# Patient Record
Sex: Female | Born: 1950 | Race: White | Hispanic: No | Smoking: Never smoker
Health system: Southern US, Community
[De-identification: ages and names within clinical notes are randomized; demographics above are authoritative.]

## PROBLEM LIST (undated history)

## (undated) DIAGNOSIS — E7800 Pure hypercholesterolemia, unspecified: Secondary | ICD-10-CM

## (undated) DIAGNOSIS — G20A1 Parkinson's disease without dyskinesia, without mention of fluctuations: Secondary | ICD-10-CM

## (undated) DIAGNOSIS — M4800 Spinal stenosis, site unspecified: Secondary | ICD-10-CM

## (undated) DIAGNOSIS — G20 Parkinson's disease: Secondary | ICD-10-CM

## (undated) DIAGNOSIS — B009 Herpesviral infection, unspecified: Secondary | ICD-10-CM

## (undated) DIAGNOSIS — K5792 Diverticulitis of intestine, part unspecified, without perforation or abscess without bleeding: Secondary | ICD-10-CM

## (undated) DIAGNOSIS — R29818 Other symptoms and signs involving the nervous system: Secondary | ICD-10-CM

## (undated) HISTORY — DX: Pure hypercholesterolemia, unspecified: E78.00

## (undated) HISTORY — DX: Parkinson's disease: G20

## (undated) HISTORY — DX: Spinal stenosis, site unspecified: M48.00

## (undated) HISTORY — DX: Other symptoms and signs involving the nervous system: R29.818

## (undated) HISTORY — DX: Diverticulitis of intestine, part unspecified, without perforation or abscess without bleeding: K57.92

## (undated) HISTORY — DX: Parkinson's disease without dyskinesia, without mention of fluctuations: G20.A1

## (undated) HISTORY — DX: Herpesviral infection, unspecified: B00.9

---

## 1985-11-25 HISTORY — PX: BARTHOLIN CYST MARSUPIALIZATION: SHX5383

## 2000-04-14 ENCOUNTER — Ambulatory Visit (HOSPITAL_COMMUNITY): Admission: RE | Admit: 2000-04-14 | Discharge: 2000-04-14 | Payer: Self-pay | Admitting: Internal Medicine

## 2003-08-31 ENCOUNTER — Encounter: Payer: Self-pay | Admitting: Obstetrics and Gynecology

## 2003-08-31 ENCOUNTER — Ambulatory Visit (HOSPITAL_COMMUNITY): Admission: RE | Admit: 2003-08-31 | Discharge: 2003-08-31 | Payer: Self-pay | Admitting: Obstetrics and Gynecology

## 2005-09-27 ENCOUNTER — Ambulatory Visit (HOSPITAL_COMMUNITY): Admission: RE | Admit: 2005-09-27 | Discharge: 2005-09-27 | Payer: Self-pay | Admitting: Obstetrics and Gynecology

## 2007-06-29 ENCOUNTER — Encounter: Admission: RE | Admit: 2007-06-29 | Discharge: 2007-06-29 | Payer: Self-pay | Admitting: Internal Medicine

## 2008-01-19 ENCOUNTER — Encounter: Admission: RE | Admit: 2008-01-19 | Discharge: 2008-01-19 | Payer: Self-pay | Admitting: Internal Medicine

## 2008-06-08 ENCOUNTER — Encounter: Admission: RE | Admit: 2008-06-08 | Discharge: 2008-06-08 | Payer: Self-pay | Admitting: Obstetrics and Gynecology

## 2009-09-22 ENCOUNTER — Encounter: Admission: RE | Admit: 2009-09-22 | Discharge: 2009-09-22 | Payer: Self-pay | Admitting: Obstetrics and Gynecology

## 2010-12-14 ENCOUNTER — Ambulatory Visit (HOSPITAL_COMMUNITY)
Admission: RE | Admit: 2010-12-14 | Discharge: 2010-12-14 | Payer: Self-pay | Source: Home / Self Care | Attending: Obstetrics and Gynecology | Admitting: Obstetrics and Gynecology

## 2010-12-14 LAB — HM MAMMOGRAPHY: HM Mammogram: NORMAL

## 2010-12-17 ENCOUNTER — Encounter: Payer: Self-pay | Admitting: Interventional Radiology

## 2010-12-18 LAB — HM PAP SMEAR: HM Pap smear: NORMAL

## 2010-12-26 ENCOUNTER — Encounter: Payer: Self-pay | Admitting: Internal Medicine

## 2011-04-12 NOTE — Op Note (Signed)
[facility]  Patient:    Eileen, Cook                           MRN: [id number] Proc. Date: 04/14/00 Attending:  Verlin Grills, M.D. CC:         Eileen Cook. Earl Gala, M.D.                           Operative Report  PROCEDURE PERFORMED:  Proctocolonoscopy.  ENDOSCOPIST:  Verlin Grills, M.D.  INDICATIONS FOR PROCEDURE:  Ms. Eileen Cook is a 60 year old female.  Her mother and grandmother have undergone surgery to treat colon cancer.  Eileen Cook is due for colorectal neoplasia screening.  In 1996, using the adult colonoscope, I was unable to complete a full colonoscopy and Eileen Cook required an air contrast barium enema.  There was no evidence of colorectal neoplasia in 1996.  I discussed with Eileen Cook the complications associated with colonoscopy and polypectomy including intestinal bleeding and intestinal perforation.  Eileen Cook has signed the operative permit.  I plan to use the pediatric colonoscope for the exam today.  PREMEDICATION:  Demerol 50 mg, Versed 9.5 mg.  ENDOSCOPE:  Olympus pediatric colonoscope.  DESCRIPTION OF PROCEDURE:  After obtaining informed consent, the patient was placed in the left lateral decubitus position.  I administered intravenous Demerol and intravenous Versed to achieve conscious sedation for the procedure.  The patients blood pressure, oxygen saturations and cardiac rhythm were monitored throughout the procedure and documented in the medical record.  Anal inspection was normal.  Digital rectal exam was normal.  The Olympus pediatric video colonoscope was introduced into the rectum and under direct vision advanced to the cecum as identified by a normal-appearing ileocecal valve and appendiceal orifice.  Colonic preparation for the exam today was excellent.  Rectum normal.  Sigmoid colon normal.  Descending colon normal.  Splenic flexure normal.  Transverse colon normal.  Hepatic flexure  normal.  Ascending colon normal.  Cecum and ileocecal valve normal.  ASSESSMENT:  Normal proctocolonoscopy to the cecum using the pediatric video colonoscope.  RECOMMENDATIONS:  Repeat colonoscopy in May 2006. DD:  04/14/00 TD:  04/17/00 Job: [id number]

## 2011-06-05 ENCOUNTER — Encounter: Payer: Self-pay | Admitting: Family Medicine

## 2011-06-23 ENCOUNTER — Other Ambulatory Visit: Payer: Self-pay | Admitting: Internal Medicine

## 2011-06-23 DIAGNOSIS — R209 Unspecified disturbances of skin sensation: Secondary | ICD-10-CM

## 2013-01-11 ENCOUNTER — Other Ambulatory Visit: Payer: Self-pay | Admitting: Obstetrics and Gynecology

## 2013-01-11 DIAGNOSIS — N63 Unspecified lump in unspecified breast: Secondary | ICD-10-CM

## 2013-01-11 DIAGNOSIS — M8580 Other specified disorders of bone density and structure, unspecified site: Secondary | ICD-10-CM

## 2013-01-20 ENCOUNTER — Other Ambulatory Visit: Payer: Self-pay

## 2013-02-10 ENCOUNTER — Ambulatory Visit
Admission: RE | Admit: 2013-02-10 | Discharge: 2013-02-10 | Disposition: A | Discharge status: Home / Self Care | Payer: BC Managed Care – PPO | Source: Ambulatory Visit | Attending: Obstetrics and Gynecology | Admitting: Obstetrics and Gynecology

## 2013-02-10 DIAGNOSIS — M8580 Other specified disorders of bone density and structure, unspecified site: Secondary | ICD-10-CM

## 2013-02-10 DIAGNOSIS — N63 Unspecified lump in unspecified breast: Secondary | ICD-10-CM

## 2013-03-09 ENCOUNTER — Other Ambulatory Visit: Payer: Self-pay | Admitting: Obstetrics and Gynecology

## 2013-03-09 DIAGNOSIS — B009 Herpesviral infection, unspecified: Secondary | ICD-10-CM

## 2013-03-09 MED ORDER — ACYCLOVIR 200 MG PO CAPS: 400.0000 mg | ORAL_CAPSULE | Freq: Every day | ORAL | Status: DC

## 2013-03-10 ENCOUNTER — Telehealth: Payer: Self-pay | Admitting: Obstetrics & Gynecology

## 2013-03-10 NOTE — Telephone Encounter (Signed)
pt calling to check on the status of her refill not sure of the name

## 2013-08-20 ENCOUNTER — Encounter: Payer: Self-pay | Admitting: Obstetrics & Gynecology

## 2014-03-09 ENCOUNTER — Ambulatory Visit: Payer: Self-pay | Admitting: Obstetrics & Gynecology

## 2014-03-11 ENCOUNTER — Encounter: Payer: Self-pay | Admitting: Obstetrics & Gynecology

## 2014-03-11 ENCOUNTER — Ambulatory Visit (INDEPENDENT_AMBULATORY_CARE_PROVIDER_SITE_OTHER): Payer: 59 | Admitting: Obstetrics & Gynecology

## 2014-03-11 VITALS — BP 138/78 | HR 60 | Resp 16 | Ht 68.75 in | Wt 185.4 lb

## 2014-03-11 DIAGNOSIS — Z0000 Encounter for general adult medical examination without abnormal findings: Secondary | ICD-10-CM

## 2014-03-11 DIAGNOSIS — Z124 Encounter for screening for malignant neoplasm of cervix: Secondary | ICD-10-CM

## 2014-03-11 DIAGNOSIS — Z01419 Encounter for gynecological examination (general) (routine) without abnormal findings: Secondary | ICD-10-CM

## 2014-03-11 DIAGNOSIS — B009 Herpesviral infection, unspecified: Secondary | ICD-10-CM

## 2014-03-11 MED ORDER — ACYCLOVIR 200 MG PO CAPS: 400.0000 mg | ORAL_CAPSULE | Freq: Every day | ORAL | Status: DC

## 2014-03-11 NOTE — Progress Notes (Signed)
63 y.o. G3P2 SingleCaucasianF here for annual exam.  Doing really well.  No vaginal bleeding.  Had an issue with a sebaceous cyst on her breast last year.  This was evaluated with MMG and ultrasound last year.    Seeing Dr. Dennard Schaumann as new   Patient's last menstrual period was 11/25/2001.          Sexually active: no  The current method of family planning is post menopausal status.    Exercising: yes  yoga, aerobic, and weights Smoker:  no  Health Maintenance: Pap:  12/09/11 WNL/negative HR HPV History of abnormal Pap:  no MMG:  02/10/13 MMG/US-normal Colonoscopy:  2010-repeat in 10 years, Dr. Edwina Barth BMD:   02/10/13, 0.0/-0.7 TDaP:  2014 Screening Labs: PCP, Hb today: PCP, Urine today: PCP   reports that she has never smoked. She has never used smokeless tobacco. She reports that she drinks about one ounce of alcohol per week. She reports that she does not use illicit drugs.  Past Medical History  Diagnosis Date  . Elevated cholesterol   . HSV-2 infection   . 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 Cesarean section    . Bartholin cyst marsupialization      Current Outpatient Prescriptions  Medication Sig Dispense Refill  . acyclovir (ZOVIRAX) 200 MG capsule Take 2 capsules (400 mg total) by mouth daily.  30 capsule  9  . celecoxib (CELEBREX) 100 MG capsule Take 100 mg by mouth as needed.         No current facility-administered medications for this visit.    Family History  Problem Relation Age of Onset  . Colon cancer Mother   . Colon cancer Maternal Grandmother   . Esophageal cancer Father   . Hypertension Sister     #1  . Mitral valve prolapse Sister     #2    ROS:  Pertinent items are noted in HPI.  Otherwise, a comprehensive ROS was negative.  Exam:   BP 138/78  Pulse 60  Resp 16  Ht 5' 8.75" (1.746 m)  Wt 185 lb 6.4 oz (84.097 kg)  BMI 27.59 kg/m2  LMP 11/25/2001  Weight change: +5lbs  Height: 5' 8.75"  (174.6 cm)  Ht Readings from Last 3 Encounters:  03/11/14 5' 8.75" (1.746 m)    General appearance: alert, cooperative and appears stated age Head: Normocephalic, without obvious abnormality, atraumatic Neck: no adenopathy, supple, symmetrical, trachea midline and thyroid normal to inspection and palpation Lungs: clear to auscultation bilaterally Breasts: normal appearance, no masses or tenderness Heart: regular rate and rhythm Abdomen: soft, non-tender; bowel sounds normal; no masses,  no organomegaly Extremities: extremities normal, atraumatic, no cyanosis or edema Skin: Skin color, texture, turgor normal. No rashes or lesions Lymph nodes: Cervical, supraclavicular, and axillary nodes normal. No abnormal inguinal nodes palpated Neurologic: Grossly normal   Pelvic: External genitalia:  no lesions              Urethra:  normal appearing urethra with no masses, tenderness or lesions              Bartholins and Skenes: normal                 Vagina: normal appearing vagina with normal color and discharge, no lesions              Cervix: no lesions  Pap taken: yes Bimanual Exam:  Uterus:  normal size, contour, position, consistency, mobility, non-tender              Adnexa: normal adnexa               Rectovaginal: Confirms               Anus:  normal sphincter tone, no lesions  A:  Well Woman with normal exam PMP, no HRT Family hx of colon cancer, mother  P:   Mammogram due.  Pt aware and she will schedule. pap smear only today Acyclovir 400mg  bid (pt takes daily).  D/W pt appropriate dosing for suppressive therapy.  #180/4RF.  Rx shingles vaccine return annually or prn  An After Visit Summary was printed and given to the patient.

## 2014-03-11 NOTE — Patient Instructions (Addendum)

## 2014-03-15 LAB — IPS PAP SMEAR ONLY

## 2014-09-26 ENCOUNTER — Encounter: Payer: Self-pay | Admitting: Obstetrics & Gynecology

## 2014-11-05 ENCOUNTER — Ambulatory Visit (HOSPITAL_COMMUNITY)
Admission: RE | Admit: 2014-11-05 | Discharge: 2014-11-05 | Disposition: A | Discharge status: Home / Self Care | Payer: Medicare HMO | Source: Ambulatory Visit | Attending: Internal Medicine | Admitting: Internal Medicine

## 2014-11-05 ENCOUNTER — Other Ambulatory Visit (HOSPITAL_COMMUNITY): Payer: Self-pay | Admitting: Internal Medicine

## 2014-11-05 DIAGNOSIS — M545 Low back pain: Secondary | ICD-10-CM | POA: Insufficient documentation

## 2014-11-05 DIAGNOSIS — Z87828 Personal history of other (healed) physical injury and trauma: Secondary | ICD-10-CM | POA: Diagnosis not present

## 2014-11-05 DIAGNOSIS — M4854XA Collapsed vertebra, not elsewhere classified, thoracic region, initial encounter for fracture: Secondary | ICD-10-CM | POA: Diagnosis not present

## 2014-11-05 DIAGNOSIS — I700 Atherosclerosis of aorta: Secondary | ICD-10-CM | POA: Insufficient documentation

## 2014-11-05 DIAGNOSIS — W109XXA Fall (on) (from) unspecified stairs and steps, initial encounter: Secondary | ICD-10-CM | POA: Insufficient documentation

## 2014-11-05 DIAGNOSIS — R52 Pain, unspecified: Secondary | ICD-10-CM

## 2014-11-05 DIAGNOSIS — M2578 Osteophyte, vertebrae: Secondary | ICD-10-CM | POA: Diagnosis not present

## 2014-12-26 ENCOUNTER — Telehealth: Payer: Self-pay | Admitting: Obstetrics & Gynecology

## 2014-12-26 NOTE — Telephone Encounter (Signed)
Left patient messages to call back and reschedule her AEX from 03/24/15 with Dr. Sabra Heck.

## 2015-03-24 ENCOUNTER — Ambulatory Visit: Payer: Medicare Other | Admitting: Obstetrics & Gynecology

## 2015-05-19 ENCOUNTER — Other Ambulatory Visit: Payer: Self-pay | Admitting: Obstetrics & Gynecology

## 2015-05-22 ENCOUNTER — Other Ambulatory Visit: Payer: Self-pay | Admitting: Obstetrics & Gynecology

## 2015-05-22 NOTE — Telephone Encounter (Signed)
Medication refill request: Acyclovir 200 mg   Last AEX: 4/17/1 5 with SM Next AEX: 01/19/16 with SM Last MMG (if hormonal medication request): n/a Refill authorized: Please advise

## 2015-05-22 NOTE — Telephone Encounter (Signed)
Medication refill request: Acyclovir 200 mg  Last AEX: 03/11/14 with SM Next AEX: 01/19/16 SM  Last MMG (if hormonal medication request): n/a Refill authorized: Please advise.

## 2016-01-09 ENCOUNTER — Other Ambulatory Visit (HOSPITAL_COMMUNITY): Payer: Self-pay | Admitting: Internal Medicine

## 2016-01-09 DIAGNOSIS — R011 Cardiac murmur, unspecified: Secondary | ICD-10-CM

## 2016-01-19 ENCOUNTER — Other Ambulatory Visit: Payer: Self-pay

## 2016-01-19 ENCOUNTER — Encounter: Payer: Self-pay | Admitting: Obstetrics & Gynecology

## 2016-01-19 ENCOUNTER — Ambulatory Visit (HOSPITAL_COMMUNITY): Payer: Medicare HMO | Attending: Cardiovascular Disease

## 2016-01-19 ENCOUNTER — Ambulatory Visit (INDEPENDENT_AMBULATORY_CARE_PROVIDER_SITE_OTHER): Payer: Medicare HMO | Admitting: Obstetrics & Gynecology

## 2016-01-19 VITALS — BP 110/70 | HR 82 | Resp 14 | Ht 68.5 in | Wt 175.0 lb

## 2016-01-19 DIAGNOSIS — I340 Nonrheumatic mitral (valve) insufficiency: Secondary | ICD-10-CM | POA: Diagnosis not present

## 2016-01-19 DIAGNOSIS — B009 Herpesviral infection, unspecified: Secondary | ICD-10-CM | POA: Diagnosis not present

## 2016-01-19 DIAGNOSIS — I071 Rheumatic tricuspid insufficiency: Secondary | ICD-10-CM | POA: Insufficient documentation

## 2016-01-19 DIAGNOSIS — R011 Cardiac murmur, unspecified: Secondary | ICD-10-CM | POA: Insufficient documentation

## 2016-01-19 DIAGNOSIS — E785 Hyperlipidemia, unspecified: Secondary | ICD-10-CM | POA: Diagnosis not present

## 2016-01-19 DIAGNOSIS — I351 Nonrheumatic aortic (valve) insufficiency: Secondary | ICD-10-CM | POA: Insufficient documentation

## 2016-01-19 DIAGNOSIS — Z124 Encounter for screening for malignant neoplasm of cervix: Secondary | ICD-10-CM | POA: Diagnosis not present

## 2016-01-19 DIAGNOSIS — Z01419 Encounter for gynecological examination (general) (routine) without abnormal findings: Secondary | ICD-10-CM | POA: Diagnosis not present

## 2016-01-19 DIAGNOSIS — Z8781 Personal history of (healed) traumatic fracture: Secondary | ICD-10-CM | POA: Diagnosis not present

## 2016-01-19 DIAGNOSIS — E2839 Other primary ovarian failure: Secondary | ICD-10-CM | POA: Diagnosis not present

## 2016-01-19 MED ORDER — ACYCLOVIR 200 MG PO CAPS: 200.0000 mg | ORAL_CAPSULE | Freq: Two times a day (BID) | ORAL | Status: DC

## 2016-01-19 NOTE — Progress Notes (Signed)
65 y.o. G3P2 SingleCaucasianF here for annual exam.  Doing well.  Pt reports she fell down stairs last year.  Has a compression fracture in the vertebrae.  Dr. Joylene Draft is her PCP.  Her lab work is up to date.  Cholesterol is being followed.    No vaginal bleeding.    Patient's last menstrual period was 11/25/2001.          Sexually active: No.  The current method of family planning is post menopausal status.    Exercising: Yes.    Yoga, walking Smoker:  no  Health Maintenance: Pap:  03/11/14 Neg History of abnormal Pap:  no MMG: 02/10/13 [place] Right BIRADS2:Benign  Colonoscopy:  2010 repeat 10 years  BMD:  02/10/13 Normal  TDaP:  2014  Pneumonia: Has completed one with Dr. Joylene Draft Screening Labs: PCP, Hb today: PCP, Urine today: PCP   reports that she has never smoked. She has never used smokeless tobacco. She reports that she drinks about 1.0 oz of alcohol per week. She reports that she does not use illicit drugs.  Past Medical History  Diagnosis Date  . Elevated cholesterol   . HSV-2 infection   . 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 Cesarean section    . Bartholin cyst marsupialization  1/87    Current Outpatient Prescriptions  Medication Sig Dispense Refill  . acyclovir (ZOVIRAX) 200 MG capsule TAKE 2 CAPSULES (400 MG TOTAL) BY MOUTH DAILY. 60 capsule 8  . celecoxib (CELEBREX) 100 MG capsule Take 100 mg by mouth as needed.       No current facility-administered medications for this visit.    Family History  Problem Relation Age of Onset  . Colon cancer Mother   . Colon cancer Maternal Grandmother   . Esophageal cancer Father   . Hypertension Sister     #1  . Mitral valve prolapse Sister     #2    ROS:  Pertinent items are noted in HPI.  Otherwise, a comprehensive ROS was negative.  Exam:   BP 110/70 mmHg  Pulse 82  Resp 14  Ht 5' 8.5" (1.74 m)  Wt 175 lb (79.379 kg)  BMI 26.22 kg/m2  LMP 11/25/2001  Weight  change: -10#  Height: 5' 8.5" (174 cm)  Ht Readings from Last 3 Encounters:  01/19/16 5' 8.5" (1.74 m)  03/11/14 5' 8.75" (1.746 m)    General appearance: alert, cooperative and appears stated age Head: Normocephalic, without obvious abnormality, atraumatic Neck: no adenopathy, supple, symmetrical, trachea midline and thyroid normal to inspection and palpation Lungs: clear to auscultation bilaterally Breasts: normal appearance, no masses or tenderness Heart: regular rate and rhythm Abdomen: soft, non-tender; bowel sounds normal; no masses,  no organomegaly Extremities: extremities normal, atraumatic, no cyanosis or edema Skin: Skin color, texture, turgor normal. No rashes or lesions Lymph nodes: Cervical, supraclavicular, and axillary nodes normal. No abnormal inguinal nodes palpated Neurologic: Grossly normal   Pelvic: External genitalia:  no lesions              Urethra:  normal appearing urethra with no masses, tenderness or lesions              Bartholins and Skenes: normal                 Vagina: normal appearing vagina with normal color and discharge, no lesions  Cervix: no lesions              Pap taken: Yes.   Bimanual Exam:  Uterus:  normal size, contour, position, consistency, mobility, non-tender              Adnexa: normal adnexa and no mass, fullness, tenderness               Rectovaginal: Confirms               Anus:  normal sphincter tone, no lesions  Chaperone was present for exam.  A:  Well Woman with normal exam PMP, no HRT Family hx of colon cancer, mother  P: Mammogram due. Pt aware and she will schedule. pap smear only today Acyclovir 200mg  bid (pt takes daily). D/W pt appropriate dosing for suppressive therapy. #180/4RF.  Vaccines and labs with Dr. Joylene Draft.  Declines zostavax. return annually or prn

## 2016-01-22 LAB — IPS PAP SMEAR ONLY

## 2016-01-31 ENCOUNTER — Other Ambulatory Visit: Payer: Self-pay

## 2016-01-31 DIAGNOSIS — Z1231 Encounter for screening mammogram for malignant neoplasm of breast: Secondary | ICD-10-CM

## 2016-02-21 ENCOUNTER — Ambulatory Visit
Admission: RE | Admit: 2016-02-21 | Discharge: 2016-02-21 | Disposition: A | Discharge status: Home / Self Care | Payer: Medicare HMO | Source: Ambulatory Visit

## 2016-02-21 ENCOUNTER — Ambulatory Visit
Admission: RE | Admit: 2016-02-21 | Discharge: 2016-02-21 | Disposition: A | Discharge status: Home / Self Care | Payer: Medicare HMO | Source: Ambulatory Visit | Attending: Obstetrics & Gynecology | Admitting: Obstetrics & Gynecology

## 2016-02-21 DIAGNOSIS — Z8781 Personal history of (healed) traumatic fracture: Secondary | ICD-10-CM

## 2016-02-21 DIAGNOSIS — E2839 Other primary ovarian failure: Secondary | ICD-10-CM

## 2016-02-21 DIAGNOSIS — Z1231 Encounter for screening mammogram for malignant neoplasm of breast: Secondary | ICD-10-CM

## 2016-09-25 DIAGNOSIS — K219 Gastro-esophageal reflux disease without esophagitis: Secondary | ICD-10-CM | POA: Insufficient documentation

## 2016-12-25 DIAGNOSIS — E559 Vitamin D deficiency, unspecified: Secondary | ICD-10-CM | POA: Diagnosis not present

## 2016-12-25 DIAGNOSIS — E784 Other hyperlipidemia: Secondary | ICD-10-CM | POA: Diagnosis not present

## 2016-12-25 DIAGNOSIS — E785 Hyperlipidemia, unspecified: Secondary | ICD-10-CM | POA: Diagnosis not present

## 2016-12-25 DIAGNOSIS — R8299 Other abnormal findings in urine: Secondary | ICD-10-CM | POA: Diagnosis not present

## 2016-12-26 DIAGNOSIS — Z6826 Body mass index (BMI) 26.0-26.9, adult: Secondary | ICD-10-CM | POA: Diagnosis not present

## 2016-12-26 DIAGNOSIS — R509 Fever, unspecified: Secondary | ICD-10-CM | POA: Diagnosis not present

## 2016-12-26 DIAGNOSIS — R05 Cough: Secondary | ICD-10-CM | POA: Diagnosis not present

## 2016-12-26 DIAGNOSIS — J181 Lobar pneumonia, unspecified organism: Secondary | ICD-10-CM | POA: Diagnosis not present

## 2017-01-01 DIAGNOSIS — I700 Atherosclerosis of aorta: Secondary | ICD-10-CM | POA: Diagnosis not present

## 2017-01-01 DIAGNOSIS — E784 Other hyperlipidemia: Secondary | ICD-10-CM | POA: Diagnosis not present

## 2017-01-01 DIAGNOSIS — M859 Disorder of bone density and structure, unspecified: Secondary | ICD-10-CM | POA: Diagnosis not present

## 2017-01-01 DIAGNOSIS — M542 Cervicalgia: Secondary | ICD-10-CM | POA: Diagnosis not present

## 2017-01-01 DIAGNOSIS — J181 Lobar pneumonia, unspecified organism: Secondary | ICD-10-CM | POA: Diagnosis not present

## 2017-01-01 DIAGNOSIS — R030 Elevated blood-pressure reading, without diagnosis of hypertension: Secondary | ICD-10-CM | POA: Diagnosis not present

## 2017-01-01 DIAGNOSIS — Z0000 Encounter for general adult medical examination without abnormal findings: Secondary | ICD-10-CM | POA: Diagnosis not present

## 2017-01-01 DIAGNOSIS — R69 Illness, unspecified: Secondary | ICD-10-CM | POA: Diagnosis not present

## 2017-01-01 DIAGNOSIS — M545 Low back pain: Secondary | ICD-10-CM | POA: Diagnosis not present

## 2017-01-01 DIAGNOSIS — E559 Vitamin D deficiency, unspecified: Secondary | ICD-10-CM | POA: Diagnosis not present

## 2017-01-03 DIAGNOSIS — Z1212 Encounter for screening for malignant neoplasm of rectum: Secondary | ICD-10-CM | POA: Diagnosis not present

## 2017-03-25 HISTORY — PX: BREAST CYST EXCISION: SHX579

## 2017-04-16 ENCOUNTER — Other Ambulatory Visit: Payer: Self-pay | Admitting: Internal Medicine

## 2017-04-16 DIAGNOSIS — Z1231 Encounter for screening mammogram for malignant neoplasm of breast: Secondary | ICD-10-CM

## 2017-04-24 DIAGNOSIS — L723 Sebaceous cyst: Secondary | ICD-10-CM | POA: Diagnosis not present

## 2017-04-24 DIAGNOSIS — L989 Disorder of the skin and subcutaneous tissue, unspecified: Secondary | ICD-10-CM | POA: Diagnosis not present

## 2017-04-24 DIAGNOSIS — L720 Epidermal cyst: Secondary | ICD-10-CM | POA: Diagnosis not present

## 2017-05-07 ENCOUNTER — Ambulatory Visit: Payer: Medicare HMO

## 2017-06-01 ENCOUNTER — Other Ambulatory Visit: Payer: Self-pay | Admitting: Obstetrics & Gynecology

## 2017-06-02 NOTE — Telephone Encounter (Signed)
Medication refill request: acyclovir Last AEX:  01/19/16 SM  Next AEX: none Last MMG (if hormonal medication request): 02/21/16 BIRADS1:neg  Refill authorized: 01/19/16 #[id number]. Today please advise.    Left voicemail to call back to schedule AEX

## 2017-06-13 DIAGNOSIS — M5136 Other intervertebral disc degeneration, lumbar region: Secondary | ICD-10-CM | POA: Diagnosis not present

## 2017-06-13 DIAGNOSIS — M546 Pain in thoracic spine: Secondary | ICD-10-CM | POA: Diagnosis not present

## 2017-06-13 DIAGNOSIS — M48062 Spinal stenosis, lumbar region with neurogenic claudication: Secondary | ICD-10-CM | POA: Diagnosis not present

## 2017-06-13 DIAGNOSIS — M4316 Spondylolisthesis, lumbar region: Secondary | ICD-10-CM | POA: Diagnosis not present

## 2017-06-13 DIAGNOSIS — M47816 Spondylosis without myelopathy or radiculopathy, lumbar region: Secondary | ICD-10-CM | POA: Diagnosis not present

## 2017-06-13 DIAGNOSIS — M5416 Radiculopathy, lumbar region: Secondary | ICD-10-CM | POA: Diagnosis not present

## 2017-06-13 DIAGNOSIS — Z6825 Body mass index (BMI) 25.0-25.9, adult: Secondary | ICD-10-CM | POA: Diagnosis not present

## 2017-06-18 ENCOUNTER — Ambulatory Visit
Admission: RE | Admit: 2017-06-18 | Discharge: 2017-06-18 | Disposition: A | Discharge status: Home / Self Care | Payer: Medicare HMO | Source: Ambulatory Visit | Attending: Internal Medicine | Admitting: Internal Medicine

## 2017-06-18 DIAGNOSIS — Z1231 Encounter for screening mammogram for malignant neoplasm of breast: Secondary | ICD-10-CM

## 2017-07-02 DIAGNOSIS — H5203 Hypermetropia, bilateral: Secondary | ICD-10-CM | POA: Diagnosis not present

## 2017-07-02 DIAGNOSIS — H25813 Combined forms of age-related cataract, bilateral: Secondary | ICD-10-CM | POA: Diagnosis not present

## 2017-07-02 DIAGNOSIS — H53001 Unspecified amblyopia, right eye: Secondary | ICD-10-CM | POA: Diagnosis not present

## 2017-07-02 DIAGNOSIS — H52203 Unspecified astigmatism, bilateral: Secondary | ICD-10-CM | POA: Diagnosis not present

## 2017-07-16 DIAGNOSIS — L570 Actinic keratosis: Secondary | ICD-10-CM | POA: Diagnosis not present

## 2017-08-20 DIAGNOSIS — R05 Cough: Secondary | ICD-10-CM | POA: Diagnosis not present

## 2017-08-20 DIAGNOSIS — Z6826 Body mass index (BMI) 26.0-26.9, adult: Secondary | ICD-10-CM | POA: Diagnosis not present

## 2017-08-20 DIAGNOSIS — J069 Acute upper respiratory infection, unspecified: Secondary | ICD-10-CM | POA: Diagnosis not present

## 2017-08-22 DIAGNOSIS — H1851 Endothelial corneal dystrophy: Secondary | ICD-10-CM | POA: Diagnosis not present

## 2017-08-22 DIAGNOSIS — H53001 Unspecified amblyopia, right eye: Secondary | ICD-10-CM | POA: Diagnosis not present

## 2017-08-22 DIAGNOSIS — H25813 Combined forms of age-related cataract, bilateral: Secondary | ICD-10-CM | POA: Diagnosis not present

## 2017-09-13 DIAGNOSIS — R69 Illness, unspecified: Secondary | ICD-10-CM | POA: Diagnosis not present

## 2017-10-08 DIAGNOSIS — R2232 Localized swelling, mass and lump, left upper limb: Secondary | ICD-10-CM | POA: Diagnosis not present

## 2017-10-08 DIAGNOSIS — M1812 Unilateral primary osteoarthritis of first carpometacarpal joint, left hand: Secondary | ICD-10-CM | POA: Diagnosis not present

## 2017-10-15 DIAGNOSIS — M1812 Unilateral primary osteoarthritis of first carpometacarpal joint, left hand: Secondary | ICD-10-CM | POA: Diagnosis not present

## 2017-10-15 DIAGNOSIS — M79645 Pain in left finger(s): Secondary | ICD-10-CM | POA: Diagnosis not present

## 2017-11-12 DIAGNOSIS — M1812 Unilateral primary osteoarthritis of first carpometacarpal joint, left hand: Secondary | ICD-10-CM | POA: Diagnosis not present

## 2017-11-12 DIAGNOSIS — R229 Localized swelling, mass and lump, unspecified: Secondary | ICD-10-CM | POA: Diagnosis not present

## 2017-11-25 HISTORY — PX: ROOT CANAL: SHX2363

## 2017-11-25 HISTORY — PX: CATARACT EXTRACTION, BILATERAL: SHX1313

## 2017-12-02 DIAGNOSIS — H21561 Pupillary abnormality, right eye: Secondary | ICD-10-CM | POA: Diagnosis not present

## 2017-12-02 DIAGNOSIS — H52201 Unspecified astigmatism, right eye: Secondary | ICD-10-CM | POA: Diagnosis not present

## 2017-12-02 DIAGNOSIS — H25811 Combined forms of age-related cataract, right eye: Secondary | ICD-10-CM | POA: Diagnosis not present

## 2017-12-02 DIAGNOSIS — H268 Other specified cataract: Secondary | ICD-10-CM | POA: Diagnosis not present

## 2017-12-02 DIAGNOSIS — H1851 Endothelial corneal dystrophy: Secondary | ICD-10-CM | POA: Diagnosis not present

## 2017-12-10 DIAGNOSIS — R69 Illness, unspecified: Secondary | ICD-10-CM | POA: Diagnosis not present

## 2017-12-12 DIAGNOSIS — R69 Illness, unspecified: Secondary | ICD-10-CM | POA: Diagnosis not present

## 2017-12-18 DIAGNOSIS — R69 Illness, unspecified: Secondary | ICD-10-CM | POA: Diagnosis not present

## 2017-12-25 DIAGNOSIS — R69 Illness, unspecified: Secondary | ICD-10-CM | POA: Diagnosis not present

## 2017-12-30 DIAGNOSIS — H268 Other specified cataract: Secondary | ICD-10-CM | POA: Diagnosis not present

## 2017-12-30 DIAGNOSIS — H1851 Endothelial corneal dystrophy: Secondary | ICD-10-CM | POA: Diagnosis not present

## 2017-12-30 DIAGNOSIS — H25812 Combined forms of age-related cataract, left eye: Secondary | ICD-10-CM | POA: Diagnosis not present

## 2017-12-30 DIAGNOSIS — H52202 Unspecified astigmatism, left eye: Secondary | ICD-10-CM | POA: Diagnosis not present

## 2018-01-26 ENCOUNTER — Ambulatory Visit: Payer: Medicare HMO | Admitting: Obstetrics & Gynecology

## 2018-01-26 NOTE — Progress Notes (Deleted)
67 y.o. G3P2 SingleCaucasianF here for annual exam.    Patient's last menstrual period was 11/25/2001.          Sexually active: {yes no:314532}  The current method of family planning is post menopausal status.    Exercising: {yes no:314532}  {types:19826} Smoker:  {YES P5382123  Health Maintenance: Pap:  01/19/16 Neg.   03/11/14 Neg  History of abnormal Pap:  no MMG:  06/18/17 BIRADS1:Neg  Colonoscopy:  2010 f/u 10 years  BMD:   02/21/16 Osteopenia  TDaP:  2014 Pneumonia vaccine(s):  *** Shingrix:   *** Hep C testing: *** Screening Labs: ***, Hb today: ***, Urine today: ***   reports that  has never smoked. she has never used smokeless tobacco. She reports that she drinks about 1.0 oz of alcohol per week. She reports that she does not use drugs.  Past Medical History:  Diagnosis Date  . 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 Elevated cholesterol   . HSV-2 infection     Past Surgical History:  Procedure Laterality Date  . BARTHOLIN CYST MARSUPIALIZATION  1/87  . BREAST CYST EXCISION Right 03/2017  . CESAREAN SECTION      Current Outpatient Medications  Medication Sig Dispense Refill  . acyclovir (ZOVIRAX) 200 MG capsule TAKE 1 CAPSULE(200 MG) BY MOUTH TWICE DAILY 180 capsule 0  . celecoxib (CELEBREX) 100 MG capsule Take 100 mg by mouth as needed.       No current facility-administered medications for this visit.     Family History  Problem Relation Age of Onset  . Colon cancer Mother   . Colon cancer Maternal Grandmother   . Esophageal cancer Father   . Hypertension Sister        #1  . Mitral valve prolapse Sister        #2    ROS:  Pertinent items are noted in HPI.  Otherwise, a comprehensive ROS was negative.  Exam:   LMP 11/25/2001   Weight change: @WEIGHTCHANGE @ Height:      Ht Readings from Last 3 Encounters:  01/19/16 5' 8.5" (1.74 m)  03/11/14 5' 8.75" (1.746 m)    General appearance: alert, cooperative and appears stated age Head: Normocephalic,  without obvious abnormality, atraumatic Neck: no adenopathy, supple, symmetrical, trachea midline and thyroid {EXAM; THYROID:18604} Lungs: clear to auscultation bilaterally Breasts: {Exam; breast:13139::"normal appearance, no masses or tenderness"} Heart: regular rate and rhythm Abdomen: soft, non-tender; bowel sounds normal; no masses,  no organomegaly Extremities: extremities normal, atraumatic, no cyanosis or edema Skin: Skin color, texture, turgor normal. No rashes or lesions Lymph nodes: Cervical, supraclavicular, and axillary nodes normal. No abnormal inguinal nodes palpated Neurologic: Grossly normal   Pelvic: External genitalia:  no lesions              Urethra:  normal appearing urethra with no masses, tenderness or lesions              Bartholins and Skenes: normal                 Vagina: normal appearing vagina with normal color and discharge, no lesions              Cervix: {exam; cervix:14595}              Pap taken: {yes no:314532} Bimanual Exam:  Uterus:  {exam; uterus:12215}              Adnexa: {exam; adnexa:12223}  Rectovaginal: Confirms               Anus:  normal sphincter tone, no lesions  Chaperone was present for exam.  A:  Well Woman with normal exam  P:   {plan; gyn:5269::"mammogram","pap smear","return annually or prn"}

## 2018-01-30 ENCOUNTER — Encounter: Payer: Self-pay | Admitting: Obstetrics & Gynecology

## 2018-01-30 ENCOUNTER — Ambulatory Visit (INDEPENDENT_AMBULATORY_CARE_PROVIDER_SITE_OTHER): Payer: Medicare HMO | Admitting: Obstetrics & Gynecology

## 2018-01-30 ENCOUNTER — Other Ambulatory Visit (HOSPITAL_COMMUNITY)
Admission: RE | Admit: 2018-01-30 | Discharge: 2018-01-30 | Disposition: A | Discharge status: Home / Self Care | Payer: Medicare HMO | Source: Ambulatory Visit | Attending: Obstetrics & Gynecology | Admitting: Obstetrics & Gynecology

## 2018-01-30 VITALS — BP 136/80 | HR 86 | Resp 14 | Ht 68.25 in | Wt 176.0 lb

## 2018-01-30 DIAGNOSIS — Z01419 Encounter for gynecological examination (general) (routine) without abnormal findings: Secondary | ICD-10-CM | POA: Insufficient documentation

## 2018-01-30 DIAGNOSIS — Z124 Encounter for screening for malignant neoplasm of cervix: Secondary | ICD-10-CM

## 2018-01-30 MED ORDER — ACYCLOVIR 200 MG PO CAPS: ORAL_CAPSULE | ORAL | 4 refills | Status: DC

## 2018-01-30 MED ORDER — CELECOXIB 100 MG PO CAPS: 100.0000 mg | ORAL_CAPSULE | ORAL | 1 refills | Status: DC | PRN

## 2018-01-30 MED ORDER — CYCLOBENZAPRINE HCL 5 MG PO TABS: 5.0000 mg | ORAL_TABLET | Freq: Three times a day (TID) | ORAL | 1 refills | Status: DC | PRN

## 2018-01-30 NOTE — Patient Instructions (Addendum)
[place]  Have Hep C testing done.

## 2018-01-30 NOTE — Progress Notes (Addendum)
67 y.o. G3P2 SingleCaucasianF here for annual exam.  Doing well.    Denies vaginal bleeding.    Having some current neck pain.  Would like RF fro celebrex and flexeril.  Doesn't take very often.    PCP:  Dr. Joylene Draft.  Has appt in two weeks and will do blood work before appt.    Patient's last menstrual period was 11/25/2001.          Sexually active: No.  The current method of family planning is post menopausal status.    Exercising: Yes.    weights, yoga, walking Smoker:  no  Health Maintenance: Pap:  01/19/16 neg   03/11/14 Neg  History of abnormal Pap:  no MMG:  06/18/17 BIRADS1:Neg  Colonoscopy:  2010. Pt states she is sure she has done this more recently. BMD:   02/21/16 Osteopenia  TDaP:  2014 Pneumonia vaccine(s):  Done  Shingrix:  No Hep C testing: n/a Screening Labs: PCP   reports that  has never smoked. she has never used smokeless tobacco. She reports that she drinks about 1.0 oz of alcohol per week. She reports that she does not use drugs.  Past Medical History:  Diagnosis Date  . 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 Elevated cholesterol   . HSV-2 infection     Past Surgical History:  Procedure Laterality Date  . BARTHOLIN CYST MARSUPIALIZATION  1/87  . BREAST CYST EXCISION Right 03/2017  . CATARACT EXTRACTION, BILATERAL  2019  . CESAREAN SECTION    . ROOT CANAL  2019    Current Outpatient Medications  Medication Sig Dispense Refill  . acyclovir (ZOVIRAX) 200 MG capsule TAKE 1 CAPSULE(200 MG) BY MOUTH TWICE DAILY (Patient taking differently: TAKE 1 CAPSULE(200 MG) BY MOUTH once DAILY) 180 capsule 0  . Calcium Carbonate-Vit D-Min (CALCIUM 1200 PO) Take by mouth.    . celecoxib (CELEBREX) 100 MG capsule Take 100 mg by mouth as needed.      . cholecalciferol (VITAMIN D) 1000 units tablet Take 2,000 Units by mouth daily.    . Multiple Vitamin (MULTIVITAMIN) tablet Take 1 tablet by mouth daily.    . prednisoLONE acetate (PRED FORTE) 1 % ophthalmic suspension INSTILL  1 DROP INTO LEFT EYE QID  0  . PROLENSA 0.07 % SOLN INSTILL 1 DROP INTO THE LEFT EYE ONCE DAILY BEGINNING ONE DAY BEFORE SURGERY  0  . vitamin C (ASCORBIC ACID) 250 MG tablet Take 250 mg by mouth daily.     No current facility-administered medications for this visit.     Family History  Problem Relation Age of Onset  . Colon cancer Mother   . Colon cancer Maternal Grandmother   . Esophageal cancer Father   . Hypertension Sister        #1  . Mitral valve prolapse Sister        #2    ROS:  Pertinent items are noted in HPI.  Otherwise, a comprehensive ROS was negative.  Exam:   BP 136/80 (BP Location: Right Arm, Patient Position: Sitting, Cuff Size: Normal)   Pulse 86   Resp 14   Ht 5' 8.25" (1.734 m)   Wt 176 lb (79.8 kg)   LMP 11/25/2001   BMI 26.57 kg/m    Height: 5' 8.25" (173.4 cm)  Ht Readings from Last 3 Encounters:  01/30/18 5' 8.25" (1.734 m)  01/19/16 5' 8.5" (1.74 m)  03/11/14 5' 8.75" (1.746 m)    General appearance: alert, cooperative and  appears stated age Head: Normocephalic, without obvious abnormality, atraumatic Neck: no adenopathy, supple, symmetrical, trachea midline and thyroid normal to inspection and palpation Lungs: clear to auscultation bilaterally Breasts: normal appearance, no masses or tenderness Heart: regular rate and rhythm Abdomen: soft, non-tender; bowel sounds normal; no masses,  no organomegaly Extremities: extremities normal, atraumatic, no cyanosis or edema Skin: Skin color, texture, turgor normal. No rashes or lesions Lymph nodes: Cervical, supraclavicular, and axillary nodes normal. No abnormal inguinal nodes palpated Neurologic: Grossly normal   Pelvic: External genitalia:  no lesions              Urethra:  normal appearing urethra with no masses, tenderness or lesions              Bartholins and Skenes: normal                 Vagina: normal appearing vagina with normal color and discharge, no lesions              Cervix: no  lesions              Pap taken: Yes.   Bimanual Exam:  Uterus:  normal size, contour, position, consistency, mobility, non-tender              Adnexa: normal adnexa and no mass, fullness, tenderness               Rectovaginal: Confirms               Anus:  normal sphincter tone, no lesions  Chaperone was present for exam.  A:  Well Woman with normal exam PMP, no HRT H/o vertebral compression fracture Family hx of colon cancer in mother Neck spasm  P:   Mammogram guidelines reviewed.   pap smear obtained today Rx for Acyclovir 200mg  bid to pharmacy.  #180/4RF RF for Celebrex 100mg  daily prn.  #30/1RF RF for Flexeril 5mg  tid  prn.  #30/1RF return annually or prn

## 2018-01-31 DIAGNOSIS — Z01419 Encounter for gynecological examination (general) (routine) without abnormal findings: Secondary | ICD-10-CM | POA: Diagnosis not present

## 2018-02-03 LAB — CYTOLOGY - PAP: Diagnosis: NEGATIVE

## 2018-02-06 ENCOUNTER — Telehealth: Payer: Self-pay | Admitting: *Deleted

## 2018-02-06 DIAGNOSIS — E7849 Other hyperlipidemia: Secondary | ICD-10-CM | POA: Diagnosis not present

## 2018-02-06 DIAGNOSIS — R82998 Other abnormal findings in urine: Secondary | ICD-10-CM | POA: Diagnosis not present

## 2018-02-06 DIAGNOSIS — E785 Hyperlipidemia, unspecified: Secondary | ICD-10-CM | POA: Diagnosis not present

## 2018-02-06 DIAGNOSIS — E559 Vitamin D deficiency, unspecified: Secondary | ICD-10-CM | POA: Diagnosis not present

## 2018-02-06 NOTE — Telephone Encounter (Signed)
Left message to call Sharee Pimple at [phone].  PA request received for Flexeril, additional information needed.

## 2018-02-09 NOTE — Telephone Encounter (Signed)
Patient returned call

## 2018-02-09 NOTE — Telephone Encounter (Signed)
Spoke with patient. Advised PA request received from pharmacy for [place]. Patient states she has already picked up Flexeril from pharmacy, no PA needed.   No PA submitted.   Routing to provider for final review. Patient is agreeable to disposition. Will close encounter.

## 2018-02-10 DIAGNOSIS — L723 Sebaceous cyst: Secondary | ICD-10-CM | POA: Diagnosis not present

## 2018-02-10 DIAGNOSIS — L821 Other seborrheic keratosis: Secondary | ICD-10-CM | POA: Diagnosis not present

## 2018-02-10 DIAGNOSIS — Z411 Encounter for cosmetic surgery: Secondary | ICD-10-CM | POA: Diagnosis not present

## 2018-02-10 DIAGNOSIS — Z23 Encounter for immunization: Secondary | ICD-10-CM | POA: Diagnosis not present

## 2018-02-10 DIAGNOSIS — L708 Other acne: Secondary | ICD-10-CM | POA: Diagnosis not present

## 2018-02-13 DIAGNOSIS — M8580 Other specified disorders of bone density and structure, unspecified site: Secondary | ICD-10-CM | POA: Diagnosis not present

## 2018-02-13 DIAGNOSIS — Z6826 Body mass index (BMI) 26.0-26.9, adult: Secondary | ICD-10-CM | POA: Diagnosis not present

## 2018-02-13 DIAGNOSIS — I700 Atherosclerosis of aorta: Secondary | ICD-10-CM | POA: Diagnosis not present

## 2018-02-13 DIAGNOSIS — Z1389 Encounter for screening for other disorder: Secondary | ICD-10-CM | POA: Diagnosis not present

## 2018-02-13 DIAGNOSIS — Z0000 Encounter for general adult medical examination without abnormal findings: Secondary | ICD-10-CM | POA: Diagnosis not present

## 2018-02-13 DIAGNOSIS — R011 Cardiac murmur, unspecified: Secondary | ICD-10-CM | POA: Diagnosis not present

## 2018-02-13 DIAGNOSIS — M542 Cervicalgia: Secondary | ICD-10-CM | POA: Diagnosis not present

## 2018-02-13 DIAGNOSIS — R251 Tremor, unspecified: Secondary | ICD-10-CM | POA: Diagnosis not present

## 2018-02-13 DIAGNOSIS — M545 Low back pain: Secondary | ICD-10-CM | POA: Diagnosis not present

## 2018-02-13 DIAGNOSIS — E785 Hyperlipidemia, unspecified: Secondary | ICD-10-CM | POA: Diagnosis not present

## 2018-02-24 ENCOUNTER — Encounter: Payer: Self-pay | Admitting: Neurology

## 2018-02-25 DIAGNOSIS — M545 Low back pain: Secondary | ICD-10-CM | POA: Diagnosis not present

## 2018-02-27 DIAGNOSIS — Q123 Congenital aphakia: Secondary | ICD-10-CM | POA: Diagnosis not present

## 2018-03-24 NOTE — Progress Notes (Signed)
Eileen Cook was seen today in the movement disorders clinic for neurologic consultation at the request of Crist Infante, MD.  The consultation is for the evaluation of tremor.  The records that were made available to me were reviewed.  Tremor: Yes.     How long has it been going on? Thinks she saw prior PCP for it 5-6 years ago.  It has gotten worse with time  At rest or with activation?  Rest and activation  Fam hx of tremor?  No.  Located where?  R and L hand equally  Affected by caffeine:  Unknown - drinks several cups coffee/day  Affected by alcohol:  Unknown  (2-3 glasses wine/week)  Affected by stress:  Yes.    Affected by fatigue:  No.  Spills soup if on spoon:  No.  Spills glass of liquid if full:  No.  Affects ADL's (tying shoes, brushing teeth, etc):  No.  Tremor inducing meds:  No.  Other Specific Symptoms:  Voice: no change Sleep: some trouble with getting to sleep  Vivid Dreams:  No.  Acting out dreams:  No. Wet Pillows: No. Postural symptoms:  No. - does yoga  Falls?  No., none in the last few years Bradykinesia symptoms: minor trouble OOC due to back issues Loss of smell:  No. Loss of taste:  No. Urinary Incontinence:  No. Difficulty Swallowing:  No. Handwriting, micrographia: she isn't sure but may be smaller  Depression:  No. Memory changes:  No. Hallucinations:  No.  visual distortions: No.  N/V:  No. Lightheaded:  No.  Syncope: No. Diplopia:  No. Dyskinesia:  No.  Neuroimaging of the brain has not previously been performed.   PREVIOUS MEDICATIONS: none to date  ALLERGIES:   Allergies  Allergen Reactions  . Codeine     CURRENT MEDICATIONS:  Outpatient Encounter Medications as of 03/27/2018  Medication Sig  . acyclovir (ZOVIRAX) 200 MG capsule TAKE 1 CAPSULE(200 MG) BY MOUTH TWICE DAILY  . Calcium Carbonate-Vit D-Min (CALCIUM 1200 PO) Take by mouth.  . celecoxib (CELEBREX) 100 MG capsule Take 1 capsule (100 mg total) by mouth as needed.  .  cholecalciferol (VITAMIN D) 1000 units tablet Take 2,000 Units by mouth daily.  . cyclobenzaprine (FLEXERIL) 5 MG tablet Take 1 tablet (5 mg total) by mouth 3 (three) times daily as needed for muscle spasms.  . Multiple Vitamin (MULTIVITAMIN) tablet Take 1 tablet by mouth daily.  . vitamin C (ASCORBIC ACID) 250 MG tablet Take 250 mg by mouth daily.  . [DISCONTINUED] prednisoLONE acetate (PRED FORTE) 1 % ophthalmic suspension INSTILL 1 DROP INTO LEFT EYE QID  . [DISCONTINUED] PROLENSA 0.07 % SOLN INSTILL 1 DROP INTO THE LEFT EYE ONCE DAILY BEGINNING ONE DAY BEFORE SURGERY   No facility-administered encounter medications on file as of 03/27/2018.     PAST MEDICAL HISTORY:   Past Medical History:  Diagnosis Date  . 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 Elevated cholesterol   . HSV-2 infection     PAST SURGICAL HISTORY:   Past Surgical History:  Procedure Laterality Date  . BARTHOLIN CYST MARSUPIALIZATION  1/87  . BREAST CYST EXCISION Right 03/2017  . CATARACT EXTRACTION, BILATERAL  2019  . CESAREAN SECTION    . ROOT CANAL  2019    SOCIAL HISTORY:   Social History   Socioeconomic History  . Marital status: Single    Spouse name: Not on file  . Number of children: Not on file  .  Years of education: Not on file  . Highest education level: Not on file  Occupational History  . Not on file  Social Needs  . Financial resource strain: Not on file  . Food insecurity:    Worry: Not on file    Inability: Not on file  . Transportation needs:    Medical: Not on file    Non-medical: Not on file  Tobacco Use  . Smoking status: Never Smoker  . Smokeless tobacco: Never Used  Substance and Sexual Activity  . Alcohol use: Yes    Alcohol/week: 1.0 oz    Types: 2 Standard drinks or equivalent per week  . Drug use: No  . Sexual activity: Never    Partners: Male  Lifestyle  . Physical activity:    Days per week: Not on file    Minutes per session: Not on file  . Stress: Not on file    Relationships  . Social connections:    Talks on phone: Not on file    Gets together: Not on file    Attends religious service: Not on file    Active member of club or organization: Not on file    Attends meetings of clubs or organizations: Not on file    Relationship status: Not on file  . Intimate partner violence:    Fear of current or ex partner: Not on file    Emotionally abused: Not on file    Physically abused: Not on file    Forced sexual activity: Not on file  Other Topics Concern  . Not on file  Social History Narrative  . Not on file    FAMILY HISTORY:   Family Status  Relation Name Status  . Mother  (Not Specified)  . MGM  (Not Specified)  . Father  (Not Specified)  . Sister  (Not Specified)  . Sister  (Not Specified)    ROS:  A complete 10 system review of systems was obtained and was unremarkable apart from what is mentioned above.  PHYSICAL EXAMINATION:    VITALS:   Vitals:   03/27/18 1342  BP: 116/68  Pulse: 62  SpO2: 98%  Weight: 178 lb (80.7 kg)  Height: 5' 8.75" (1.746 m)    GEN:  The patient appears stated age and is in NAD. HEENT:  Normocephalic, atraumatic.  The mucous membranes are moist. The superficial temporal arteries are without ropiness or tenderness. CV:  RRR Lungs:  CTAB Neck/HEME:  There are no carotid bruits bilaterally.  Neurological examination:  Orientation: The patient is alert and oriented x3. Fund of knowledge is appropriate.  Recent and remote memory are intact.  Attention and concentration are normal.    Able to name objects and repeat phrases. Cranial nerves: There is good facial symmetry.  There is no facial hypomania.  Pupils are equal round and reactive to light bilaterally. Fundoscopic exam reveals clear margins bilaterally. Extraocular muscles are intact. The visual fields are full to confrontational testing. The speech is fluent and clear. Soft palate rises symmetrically and there is no tongue deviation. Hearing is  intact to conversational tone. Sensation: Sensation is intact to light and pinprick throughout (facial, trunk, extremities). Vibration is intact at the bilateral big toe. There is no extinction with double simultaneous stimulation. There is no sensory dermatomal level identified. Motor: Strength is 5/5 in the bilateral upper and lower extremities.   Shoulder shrug is equal and symmetric.  There is no pronator drift. Deep tendon reflexes: Deep tendon reflexes  are 2/4 at the bilateral biceps, triceps, brachioradialis, patella and achilles. Plantar responses are downgoing bilaterally.  Movement examination: Tone: There is normal tone in the bilateral upper extremities.  The tone in the lower extremities is normal.  Abnormal movements: There is right greater than left upper extremity resting tremor that is worse with distraction.  There is intermittent jaw tremor.  There is no postural tremor.  There is no intention tremor.  She has no trouble pouring water from one glass to another.  Archimedes spirals are drawn well. Coordination:  There is no decremation with RAM's, with any form of RAMS, including alternating supination and pronation of the forearm, hand opening and closing, finger taps, heel taps and toe taps.   Gait and Station: The patient has no difficulty arising out of a deep-seated chair without the use of the hands. The patient's stride length is normal with good arm swing.  The patient has a negative pull test.      Labs: Lab work is reviewed.  It is dated February 06, 2018.  Sodium was 140, potassium 4.5, chloride 107, CO2 23, BUN 15 and creatinine 0.8.  White cells are 6.3, hemoglobin 13.7, hematocrit 41.3 and platelets 293.  TSH was normal at 1.01.  ASSESSMENT/PLAN:  1.  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  -Talked to her today about the fact that she does not meet the [place] brain bank criteria for the diagnosis of Parkinson's disease.  She asked me if this  will progress ultimately the Parkinson's disease, and I told her there was a possibility of so.  However, she exhibits no bradykinesia and no rigidity along with no postural instability.  She would need to exhibit at least bradykinesia to have the diagnosis.  -We discussed DaT scanning, but I ultimately told her I did not think it would be beneficial, as it really is not going to change anything.  -The most important part for her would be allowing periodic movement disorder examinations, every 8 months to yearly.  If she notices new neurologic symptoms, will be happy to see her back before that.  -Talked to her about the value of safe, cardiovascular exercise.  Talked to her about spin bikes and goals for that.  She reports that she was already going to get a spin bike, because she has problems doing other exercises because of back pain (is doing yoga).  2.  As above, I will see her back in the next 8 months or so.  Much greater than 50% of this visit was spent in counseling and coordinating care.  Total face to face time:  45 min  Cc:  Crist Infante, MD

## 2018-03-27 ENCOUNTER — Encounter: Payer: Self-pay | Admitting: Neurology

## 2018-03-27 ENCOUNTER — Ambulatory Visit: Payer: Medicare HMO | Admitting: Neurology

## 2018-03-27 VITALS — BP 116/68 | HR 62 | Ht 68.75 in | Wt 178.0 lb

## 2018-03-27 DIAGNOSIS — R259 Unspecified abnormal involuntary movements: Secondary | ICD-10-CM | POA: Diagnosis not present

## 2018-03-27 DIAGNOSIS — G252 Other specified forms of tremor: Secondary | ICD-10-CM

## 2018-03-27 NOTE — Patient Instructions (Signed)
1.  Good to see you today!   2.  Exercise! 3.  Call me if you need me before next visit.

## 2018-04-30 DIAGNOSIS — M5033 Other cervical disc degeneration, cervicothoracic region: Secondary | ICD-10-CM | POA: Diagnosis not present

## 2018-04-30 DIAGNOSIS — M9901 Segmental and somatic dysfunction of cervical region: Secondary | ICD-10-CM | POA: Diagnosis not present

## 2018-04-30 DIAGNOSIS — Q72812 Congenital shortening of left lower limb: Secondary | ICD-10-CM | POA: Diagnosis not present

## 2018-04-30 DIAGNOSIS — M9905 Segmental and somatic dysfunction of pelvic region: Secondary | ICD-10-CM | POA: Diagnosis not present

## 2018-05-04 DIAGNOSIS — M9901 Segmental and somatic dysfunction of cervical region: Secondary | ICD-10-CM | POA: Diagnosis not present

## 2018-05-04 DIAGNOSIS — M5033 Other cervical disc degeneration, cervicothoracic region: Secondary | ICD-10-CM | POA: Diagnosis not present

## 2018-05-04 DIAGNOSIS — Q72812 Congenital shortening of left lower limb: Secondary | ICD-10-CM | POA: Diagnosis not present

## 2018-05-04 DIAGNOSIS — M9905 Segmental and somatic dysfunction of pelvic region: Secondary | ICD-10-CM | POA: Diagnosis not present

## 2018-05-07 DIAGNOSIS — M9901 Segmental and somatic dysfunction of cervical region: Secondary | ICD-10-CM | POA: Diagnosis not present

## 2018-05-07 DIAGNOSIS — Q72812 Congenital shortening of left lower limb: Secondary | ICD-10-CM | POA: Diagnosis not present

## 2018-05-07 DIAGNOSIS — M5033 Other cervical disc degeneration, cervicothoracic region: Secondary | ICD-10-CM | POA: Diagnosis not present

## 2018-05-07 DIAGNOSIS — M9905 Segmental and somatic dysfunction of pelvic region: Secondary | ICD-10-CM | POA: Diagnosis not present

## 2018-05-18 DIAGNOSIS — Q72812 Congenital shortening of left lower limb: Secondary | ICD-10-CM | POA: Diagnosis not present

## 2018-05-18 DIAGNOSIS — M9905 Segmental and somatic dysfunction of pelvic region: Secondary | ICD-10-CM | POA: Diagnosis not present

## 2018-05-18 DIAGNOSIS — M9901 Segmental and somatic dysfunction of cervical region: Secondary | ICD-10-CM | POA: Diagnosis not present

## 2018-05-18 DIAGNOSIS — M5033 Other cervical disc degeneration, cervicothoracic region: Secondary | ICD-10-CM | POA: Diagnosis not present

## 2018-05-21 DIAGNOSIS — M9905 Segmental and somatic dysfunction of pelvic region: Secondary | ICD-10-CM | POA: Diagnosis not present

## 2018-05-21 DIAGNOSIS — Q72812 Congenital shortening of left lower limb: Secondary | ICD-10-CM | POA: Diagnosis not present

## 2018-05-21 DIAGNOSIS — M9901 Segmental and somatic dysfunction of cervical region: Secondary | ICD-10-CM | POA: Diagnosis not present

## 2018-05-21 DIAGNOSIS — M5033 Other cervical disc degeneration, cervicothoracic region: Secondary | ICD-10-CM | POA: Diagnosis not present

## 2018-05-25 DIAGNOSIS — M9905 Segmental and somatic dysfunction of pelvic region: Secondary | ICD-10-CM | POA: Diagnosis not present

## 2018-05-25 DIAGNOSIS — Q72812 Congenital shortening of left lower limb: Secondary | ICD-10-CM | POA: Diagnosis not present

## 2018-05-25 DIAGNOSIS — M5033 Other cervical disc degeneration, cervicothoracic region: Secondary | ICD-10-CM | POA: Diagnosis not present

## 2018-05-25 DIAGNOSIS — M9901 Segmental and somatic dysfunction of cervical region: Secondary | ICD-10-CM | POA: Diagnosis not present

## 2018-06-04 DIAGNOSIS — M9905 Segmental and somatic dysfunction of pelvic region: Secondary | ICD-10-CM | POA: Diagnosis not present

## 2018-06-04 DIAGNOSIS — M9901 Segmental and somatic dysfunction of cervical region: Secondary | ICD-10-CM | POA: Diagnosis not present

## 2018-06-04 DIAGNOSIS — M5033 Other cervical disc degeneration, cervicothoracic region: Secondary | ICD-10-CM | POA: Diagnosis not present

## 2018-06-04 DIAGNOSIS — Q72812 Congenital shortening of left lower limb: Secondary | ICD-10-CM | POA: Diagnosis not present

## 2018-06-08 DIAGNOSIS — Q72812 Congenital shortening of left lower limb: Secondary | ICD-10-CM | POA: Diagnosis not present

## 2018-06-08 DIAGNOSIS — M9901 Segmental and somatic dysfunction of cervical region: Secondary | ICD-10-CM | POA: Diagnosis not present

## 2018-06-08 DIAGNOSIS — M5033 Other cervical disc degeneration, cervicothoracic region: Secondary | ICD-10-CM | POA: Diagnosis not present

## 2018-06-08 DIAGNOSIS — M9905 Segmental and somatic dysfunction of pelvic region: Secondary | ICD-10-CM | POA: Diagnosis not present

## 2018-06-11 DIAGNOSIS — M9905 Segmental and somatic dysfunction of pelvic region: Secondary | ICD-10-CM | POA: Diagnosis not present

## 2018-06-11 DIAGNOSIS — M9901 Segmental and somatic dysfunction of cervical region: Secondary | ICD-10-CM | POA: Diagnosis not present

## 2018-06-11 DIAGNOSIS — Q72812 Congenital shortening of left lower limb: Secondary | ICD-10-CM | POA: Diagnosis not present

## 2018-06-11 DIAGNOSIS — M5033 Other cervical disc degeneration, cervicothoracic region: Secondary | ICD-10-CM | POA: Diagnosis not present

## 2018-06-15 DIAGNOSIS — M9905 Segmental and somatic dysfunction of pelvic region: Secondary | ICD-10-CM | POA: Diagnosis not present

## 2018-06-15 DIAGNOSIS — Q72812 Congenital shortening of left lower limb: Secondary | ICD-10-CM | POA: Diagnosis not present

## 2018-06-15 DIAGNOSIS — M9901 Segmental and somatic dysfunction of cervical region: Secondary | ICD-10-CM | POA: Diagnosis not present

## 2018-06-15 DIAGNOSIS — M5033 Other cervical disc degeneration, cervicothoracic region: Secondary | ICD-10-CM | POA: Diagnosis not present

## 2018-06-18 DIAGNOSIS — M9905 Segmental and somatic dysfunction of pelvic region: Secondary | ICD-10-CM | POA: Diagnosis not present

## 2018-06-18 DIAGNOSIS — M9901 Segmental and somatic dysfunction of cervical region: Secondary | ICD-10-CM | POA: Diagnosis not present

## 2018-06-18 DIAGNOSIS — M5033 Other cervical disc degeneration, cervicothoracic region: Secondary | ICD-10-CM | POA: Diagnosis not present

## 2018-06-18 DIAGNOSIS — Q72812 Congenital shortening of left lower limb: Secondary | ICD-10-CM | POA: Diagnosis not present

## 2018-06-23 DIAGNOSIS — M9905 Segmental and somatic dysfunction of pelvic region: Secondary | ICD-10-CM | POA: Diagnosis not present

## 2018-06-23 DIAGNOSIS — Q72812 Congenital shortening of left lower limb: Secondary | ICD-10-CM | POA: Diagnosis not present

## 2018-06-23 DIAGNOSIS — M9901 Segmental and somatic dysfunction of cervical region: Secondary | ICD-10-CM | POA: Diagnosis not present

## 2018-06-23 DIAGNOSIS — M5033 Other cervical disc degeneration, cervicothoracic region: Secondary | ICD-10-CM | POA: Diagnosis not present

## 2018-06-25 DIAGNOSIS — M9901 Segmental and somatic dysfunction of cervical region: Secondary | ICD-10-CM | POA: Diagnosis not present

## 2018-06-25 DIAGNOSIS — M9905 Segmental and somatic dysfunction of pelvic region: Secondary | ICD-10-CM | POA: Diagnosis not present

## 2018-06-25 DIAGNOSIS — Q72812 Congenital shortening of left lower limb: Secondary | ICD-10-CM | POA: Diagnosis not present

## 2018-06-25 DIAGNOSIS — M5033 Other cervical disc degeneration, cervicothoracic region: Secondary | ICD-10-CM | POA: Diagnosis not present

## 2018-07-01 DIAGNOSIS — M9901 Segmental and somatic dysfunction of cervical region: Secondary | ICD-10-CM | POA: Diagnosis not present

## 2018-07-01 DIAGNOSIS — M5033 Other cervical disc degeneration, cervicothoracic region: Secondary | ICD-10-CM | POA: Diagnosis not present

## 2018-07-01 DIAGNOSIS — M9905 Segmental and somatic dysfunction of pelvic region: Secondary | ICD-10-CM | POA: Diagnosis not present

## 2018-07-01 DIAGNOSIS — Q72812 Congenital shortening of left lower limb: Secondary | ICD-10-CM | POA: Diagnosis not present

## 2018-07-03 DIAGNOSIS — Q72812 Congenital shortening of left lower limb: Secondary | ICD-10-CM | POA: Diagnosis not present

## 2018-07-03 DIAGNOSIS — M5033 Other cervical disc degeneration, cervicothoracic region: Secondary | ICD-10-CM | POA: Diagnosis not present

## 2018-07-03 DIAGNOSIS — M9901 Segmental and somatic dysfunction of cervical region: Secondary | ICD-10-CM | POA: Diagnosis not present

## 2018-07-03 DIAGNOSIS — M9905 Segmental and somatic dysfunction of pelvic region: Secondary | ICD-10-CM | POA: Diagnosis not present

## 2018-07-06 DIAGNOSIS — Q72812 Congenital shortening of left lower limb: Secondary | ICD-10-CM | POA: Diagnosis not present

## 2018-07-06 DIAGNOSIS — M9901 Segmental and somatic dysfunction of cervical region: Secondary | ICD-10-CM | POA: Diagnosis not present

## 2018-07-06 DIAGNOSIS — M9905 Segmental and somatic dysfunction of pelvic region: Secondary | ICD-10-CM | POA: Diagnosis not present

## 2018-07-06 DIAGNOSIS — M5033 Other cervical disc degeneration, cervicothoracic region: Secondary | ICD-10-CM | POA: Diagnosis not present

## 2018-07-09 DIAGNOSIS — M9901 Segmental and somatic dysfunction of cervical region: Secondary | ICD-10-CM | POA: Diagnosis not present

## 2018-07-09 DIAGNOSIS — M5033 Other cervical disc degeneration, cervicothoracic region: Secondary | ICD-10-CM | POA: Diagnosis not present

## 2018-07-09 DIAGNOSIS — Q72812 Congenital shortening of left lower limb: Secondary | ICD-10-CM | POA: Diagnosis not present

## 2018-07-09 DIAGNOSIS — M9905 Segmental and somatic dysfunction of pelvic region: Secondary | ICD-10-CM | POA: Diagnosis not present

## 2018-07-16 DIAGNOSIS — M47812 Spondylosis without myelopathy or radiculopathy, cervical region: Secondary | ICD-10-CM | POA: Diagnosis not present

## 2018-07-16 DIAGNOSIS — M47816 Spondylosis without myelopathy or radiculopathy, lumbar region: Secondary | ICD-10-CM | POA: Diagnosis not present

## 2018-07-17 DIAGNOSIS — M5033 Other cervical disc degeneration, cervicothoracic region: Secondary | ICD-10-CM | POA: Diagnosis not present

## 2018-07-17 DIAGNOSIS — M9905 Segmental and somatic dysfunction of pelvic region: Secondary | ICD-10-CM | POA: Diagnosis not present

## 2018-07-17 DIAGNOSIS — Q72812 Congenital shortening of left lower limb: Secondary | ICD-10-CM | POA: Diagnosis not present

## 2018-07-17 DIAGNOSIS — M9901 Segmental and somatic dysfunction of cervical region: Secondary | ICD-10-CM | POA: Diagnosis not present

## 2018-07-22 DIAGNOSIS — M9905 Segmental and somatic dysfunction of pelvic region: Secondary | ICD-10-CM | POA: Diagnosis not present

## 2018-07-22 DIAGNOSIS — M5033 Other cervical disc degeneration, cervicothoracic region: Secondary | ICD-10-CM | POA: Diagnosis not present

## 2018-07-22 DIAGNOSIS — M9901 Segmental and somatic dysfunction of cervical region: Secondary | ICD-10-CM | POA: Diagnosis not present

## 2018-07-22 DIAGNOSIS — Q72812 Congenital shortening of left lower limb: Secondary | ICD-10-CM | POA: Diagnosis not present

## 2018-08-29 DIAGNOSIS — Z23 Encounter for immunization: Secondary | ICD-10-CM | POA: Diagnosis not present

## 2018-12-17 NOTE — Progress Notes (Signed)
Eileen Cook was seen today in the movement disorders clinic for follow up of tremor.  Reports today that tremor is getting worse in the R hand and some in the mouth.  She thinks that she has some occasionally in the L hand.  Pt denies falls.  Pt denies lightheadedness, near syncope.  No hallucinations.  Mood has been good.  Doing yoga and elliptical but back pain limiting some exercise.    PREVIOUS MEDICATIONS: none to date  ALLERGIES:   Allergies  Allergen Reactions  . Codeine     CURRENT MEDICATIONS:  Outpatient Encounter Medications as of 12/18/2018  Medication Sig  . acyclovir (ZOVIRAX) 200 MG capsule TAKE 1 CAPSULE(200 MG) BY MOUTH TWICE DAILY  . Calcium Carbonate-Vit D-Min (CALCIUM 1200 PO) Take by mouth.  . celecoxib (CELEBREX) 100 MG capsule Take 1 capsule (100 mg total) by mouth as needed.  . cholecalciferol (VITAMIN D) 1000 units tablet Take 2,000 Units by mouth daily.  . cyclobenzaprine (FLEXERIL) 5 MG tablet Take 1 tablet (5 mg total) by mouth 3 (three) times daily as needed for muscle spasms.  . Multiple Vitamin (MULTIVITAMIN) tablet Take 1 tablet by mouth daily.  . vitamin C (ASCORBIC ACID) 250 MG tablet Take 250 mg by mouth daily.   No facility-administered encounter medications on file as of 12/18/2018.     PAST MEDICAL HISTORY:   Past Medical History:  Diagnosis Date  . 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 Elevated cholesterol   . HSV-2 infection     PAST SURGICAL HISTORY:   Past Surgical History:  Procedure Laterality Date  . BARTHOLIN CYST MARSUPIALIZATION  1/87  . BREAST CYST EXCISION Right 03/2017  . CATARACT EXTRACTION, BILATERAL  2019  . CESAREAN SECTION    . ROOT CANAL  2019    SOCIAL HISTORY:   Social History   Socioeconomic History  . Marital status: Single    Spouse name: Not on file  . Number of children: Not on file  . Years of education: Not on file  . Highest education level: Not on file  Occupational History  . Occupation: Social worker    Comment: mental health  Social Needs  . Financial resource strain: Not on file  . Food insecurity:    Worry: Not on file    Inability: Not on file  . Transportation needs:    Medical: Not on file    Non-medical: Not on file  Tobacco Use  . Smoking status: Never Smoker  . Smokeless tobacco: Never Used  Substance and Sexual Activity  . Alcohol use: Yes    Alcohol/week: 2.0 standard drinks    Types: 2 Standard drinks or equivalent per week  . Drug use: No  . Sexual activity: Never    Partners: Male  Lifestyle  . Physical activity:    Days per week: Not on file    Minutes per session: Not on file  . Stress: Not on file  Relationships  . Social connections:    Talks on phone: Not on file    Gets together: Not on file    Attends religious service: Not on file    Active member of club or organization: Not on file    Attends meetings of clubs or organizations: Not on file    Relationship status: Not on file  . Intimate partner violence:    Fear of current or ex partner: Not on file    Emotionally abused: Not on file  Physically abused: Not on file    Forced sexual activity: Not on file  Other Topics Concern  . Not on file  Social History Narrative  . Not on file    FAMILY HISTORY:   Family Status  Relation Name Status  . Mother  Alive  . MGM  (Not Specified)  . Father  Deceased  . Sister  Alive  . Sister  Alive  . Sister  Alive  . Sister  Alive  . Son 2 Alive    ROS:  A complete 10 system review of systems was obtained and was unremarkable apart from what is mentioned above.  PHYSICAL EXAMINATION:    VITALS:   Vitals:   12/18/18 1431  BP: 124/86  Pulse: 78  SpO2: 98%  Weight: 179 lb (81.2 kg)  Height: 5' 8.5" (1.74 m)    GEN:  The patient appears stated age and is in NAD. HEENT:  Normocephalic, atraumatic.  The mucous membranes are moist. The superficial temporal arteries are without ropiness or tenderness. CV:  RRR Lungs:  CTAB Neck/HEME:  There  are no carotid bruits bilaterally.  Neurological examination:  Orientation: The patient is alert and oriented x3. Cranial nerves: There is good facial symmetry. The speech is fluent and clear. Soft palate rises symmetrically and there is no tongue deviation. Hearing is intact to conversational tone. Sensation: Sensation is intact to light touch throughout Motor: Strength is 5/5 in the bilateral upper and lower extremities.   Shoulder shrug is equal and symmetric.  There is no pronator drift.   Movement examination: Tone: There is mild increased tone in the RUE Abnormal movements: There is right greater than left upper extremity resting tremor that is worse with distraction.  There is intermittent jaw tremor.   Coordination:  There is mild decremation with finger taps and hand opening and closing on the right.  All other RAMs are normal.   Gait and Station: The patient has no difficulty arising out of a deep-seated chair without the use of the hands. The patient's stride length is normal with good arm swing.  The patient has a negative pull test.      Labs: Lab work is reviewed.  It is dated February 06, 2018.  Sodium was 140, potassium 4.5, chloride 107, CO2 23, BUN 15 and creatinine 0.8.  White cells are 6.3, hemoglobin 13.7, hematocrit 41.3 and platelets 293.  TSH was normal at 1.01.  ASSESSMENT/PLAN:  1.  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  -We discussed the diagnosis as well as pathophysiology of the disease.  We discussed treatment options as well as prognostic indicators.  Patient education was provided.  -We discussed that it used to be thought that levodopa would increase risk of melanoma but now it is believed that Parkinsons itself likely increases risk of melanoma. she is to get regular skin checks.  She states that she already gets regular skin checks.  -Greater than 50% of the 40  minute visit was spent in counseling answering questions and talking about what to expect now as well as in the future.  We talked about medication options as well as potential future surgical options.  We talked about safety in the home.  -We talked about various medications, but ultimately she decided to hold off on any for right now.  -Patient still works full-time as a Social worker.  Nonetheless, I recommended counseling to her, if  nothing else for adjustment.  She was given resources.  She was also given resources for community Parkinson's programs.  2.  Follow-up in 6 months, sooner should new neurologic issues arise.  Cc:  Crist Infante, MD

## 2018-12-18 ENCOUNTER — Ambulatory Visit: Payer: Medicare HMO | Admitting: Neurology

## 2018-12-18 ENCOUNTER — Encounter: Payer: Self-pay | Admitting: Neurology

## 2018-12-18 VITALS — BP 124/86 | HR 78 | Ht 68.5 in | Wt 179.0 lb

## 2018-12-18 DIAGNOSIS — G20 Parkinson's disease: Secondary | ICD-10-CM

## 2019-01-06 DIAGNOSIS — M859 Disorder of bone density and structure, unspecified: Secondary | ICD-10-CM | POA: Diagnosis not present

## 2019-02-02 ENCOUNTER — Other Ambulatory Visit: Payer: Self-pay | Admitting: Obstetrics & Gynecology

## 2019-02-03 NOTE — Telephone Encounter (Signed)
Medication refill request: Acyclovir Last AEX:  01/30/18 SM Next AEX: Not scheduled Last MMG (if hormonal medication request): 06/18/17 BIRADS 1 negative/density b Refill authorized: 01/30/18 #180 w/4 refills; Order pended for #60 w/0 refills if authorized. Tried calling patient to schedule AEX. No answer, left message to call our office back to schedule.

## 2019-03-10 DIAGNOSIS — E785 Hyperlipidemia, unspecified: Secondary | ICD-10-CM | POA: Diagnosis not present

## 2019-03-10 DIAGNOSIS — E7849 Other hyperlipidemia: Secondary | ICD-10-CM | POA: Diagnosis not present

## 2019-03-10 DIAGNOSIS — E559 Vitamin D deficiency, unspecified: Secondary | ICD-10-CM | POA: Diagnosis not present

## 2019-03-10 DIAGNOSIS — R7989 Other specified abnormal findings of blood chemistry: Secondary | ICD-10-CM | POA: Diagnosis not present

## 2019-03-15 DIAGNOSIS — R82998 Other abnormal findings in urine: Secondary | ICD-10-CM | POA: Diagnosis not present

## 2019-03-17 DIAGNOSIS — E559 Vitamin D deficiency, unspecified: Secondary | ICD-10-CM | POA: Diagnosis not present

## 2019-03-17 DIAGNOSIS — M4850XS Collapsed vertebra, not elsewhere classified, site unspecified, sequela of fracture: Secondary | ICD-10-CM | POA: Diagnosis not present

## 2019-03-17 DIAGNOSIS — E785 Hyperlipidemia, unspecified: Secondary | ICD-10-CM | POA: Diagnosis not present

## 2019-03-17 DIAGNOSIS — M1990 Unspecified osteoarthritis, unspecified site: Secondary | ICD-10-CM | POA: Diagnosis not present

## 2019-03-17 DIAGNOSIS — M8580 Other specified disorders of bone density and structure, unspecified site: Secondary | ICD-10-CM | POA: Diagnosis not present

## 2019-03-17 DIAGNOSIS — R011 Cardiac murmur, unspecified: Secondary | ICD-10-CM | POA: Diagnosis not present

## 2019-03-17 DIAGNOSIS — Z0000 Encounter for general adult medical examination without abnormal findings: Secondary | ICD-10-CM | POA: Diagnosis not present

## 2019-03-17 DIAGNOSIS — I700 Atherosclerosis of aorta: Secondary | ICD-10-CM | POA: Diagnosis not present

## 2019-03-17 DIAGNOSIS — R69 Illness, unspecified: Secondary | ICD-10-CM | POA: Diagnosis not present

## 2019-03-17 DIAGNOSIS — M545 Low back pain: Secondary | ICD-10-CM | POA: Diagnosis not present

## 2019-03-23 ENCOUNTER — Other Ambulatory Visit: Payer: Self-pay | Admitting: Internal Medicine

## 2019-03-23 DIAGNOSIS — E785 Hyperlipidemia, unspecified: Secondary | ICD-10-CM

## 2019-04-15 DIAGNOSIS — E785 Hyperlipidemia, unspecified: Secondary | ICD-10-CM | POA: Diagnosis not present

## 2019-04-15 DIAGNOSIS — R0789 Other chest pain: Secondary | ICD-10-CM | POA: Diagnosis not present

## 2019-04-15 DIAGNOSIS — R030 Elevated blood-pressure reading, without diagnosis of hypertension: Secondary | ICD-10-CM | POA: Diagnosis not present

## 2019-06-11 DIAGNOSIS — M48062 Spinal stenosis, lumbar region with neurogenic claudication: Secondary | ICD-10-CM | POA: Diagnosis not present

## 2019-06-11 DIAGNOSIS — M48061 Spinal stenosis, lumbar region without neurogenic claudication: Secondary | ICD-10-CM | POA: Diagnosis not present

## 2019-06-11 DIAGNOSIS — M546 Pain in thoracic spine: Secondary | ICD-10-CM | POA: Diagnosis not present

## 2019-06-11 DIAGNOSIS — M5136 Other intervertebral disc degeneration, lumbar region: Secondary | ICD-10-CM | POA: Diagnosis not present

## 2019-06-11 DIAGNOSIS — M47816 Spondylosis without myelopathy or radiculopathy, lumbar region: Secondary | ICD-10-CM | POA: Diagnosis not present

## 2019-06-11 DIAGNOSIS — M8580 Other specified disorders of bone density and structure, unspecified site: Secondary | ICD-10-CM | POA: Diagnosis not present

## 2019-06-11 DIAGNOSIS — M4156 Other secondary scoliosis, lumbar region: Secondary | ICD-10-CM | POA: Diagnosis not present

## 2019-06-28 ENCOUNTER — Ambulatory Visit (INDEPENDENT_AMBULATORY_CARE_PROVIDER_SITE_OTHER): Payer: Medicare HMO | Admitting: Obstetrics & Gynecology

## 2019-06-28 ENCOUNTER — Encounter

## 2019-06-28 ENCOUNTER — Encounter: Payer: Self-pay | Admitting: Obstetrics & Gynecology

## 2019-06-28 ENCOUNTER — Other Ambulatory Visit: Payer: Self-pay

## 2019-06-28 VITALS — BP 122/80 | HR 64 | Temp 97.4°F | Ht 68.0 in | Wt 173.0 lb

## 2019-06-28 DIAGNOSIS — Z01419 Encounter for gynecological examination (general) (routine) without abnormal findings: Secondary | ICD-10-CM

## 2019-06-28 DIAGNOSIS — Z1211 Encounter for screening for malignant neoplasm of colon: Secondary | ICD-10-CM | POA: Diagnosis not present

## 2019-06-28 MED ORDER — ACYCLOVIR 200 MG PO CAPS: 200.0000 mg | ORAL_CAPSULE | Freq: Two times a day (BID) | ORAL | 4 refills | Status: DC

## 2019-06-28 NOTE — Patient Instructions (Addendum)
Movement Disorders Clinic Coordinator [phone]  Dr. Eldridge Abrahams

## 2019-06-28 NOTE — Progress Notes (Signed)
68 y.o. G3P2 Single White or Caucasian female here for annual exam.  She was just diagnosed with Parkinson's disease.  Followed by Dr. Carles Collet.    Denies vaginal bleeding.    PCP:  Dr. Joylene Draft.  Last appt was march.  She did blood work at that time.    Patient's last menstrual period was 11/25/2001.          Sexually active: No.  The current method of family planning is post menopausal status.    Exercising: Yes.    yoga, walking Smoker:  no  Health Maintenance: Pap:  01/30/18 neg   01/19/16 Neg  History of abnormal Pap:  no MMG:  06/18/17 BIRADS1:neg.  She did have one rescheduled due to Covid.   Colonoscopy:  2010 f/u 10 years. BMD:   02/21/16 osteopenia.  Pt did one this past year.   TDaP:  2014 Pneumonia vaccine(s):  Done  Shingrix:   No Hep C testing: No Screening Labs: PCP   reports that she has never smoked. She has never used smokeless tobacco. She reports current alcohol use of about 3.0 standard drinks of alcohol per week. She reports that she does not use drugs.  Past Medical History:  Diagnosis Date  . 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 Elevated cholesterol   . HSV-2 infection   . Parkinson disease ([place])    Dr. Carles Collet    Past Surgical History:  Procedure Laterality Date  . BARTHOLIN CYST MARSUPIALIZATION  1/87  . BREAST CYST EXCISION Right 03/2017  . CATARACT EXTRACTION, BILATERAL  2019  . CESAREAN SECTION    . ROOT CANAL  2019    Current Outpatient Medications  Medication Sig Dispense Refill  . acyclovir (ZOVIRAX) 200 MG capsule TAKE 1 CAPSULE(200 MG) BY MOUTH TWICE DAILY 180 capsule 0  . Calcium Carbonate-Vit D-Min (CALCIUM 1200 PO) Take by mouth.    . cholecalciferol (VITAMIN D) 1000 units tablet Take 2,000 Units by mouth daily.    . Multiple Vitamin (MULTIVITAMIN) tablet Take 1 tablet by mouth daily.    . simvastatin (ZOCOR) 20 MG tablet Take 1 tablet by mouth daily.    . vitamin C (ASCORBIC ACID) 250 MG tablet Take 250 mg by mouth daily.     No current  facility-administered medications for this visit.     Family History  Problem Relation Age of Onset  . Colon cancer Mother   . Colon cancer Maternal Grandmother   . Esophageal cancer Father   . Hypertension Sister        #1  . Mitral valve prolapse Sister        #2    Review of Systems  All other systems reviewed and are negative.   Exam:   BP 122/80   Pulse 64   Temp (!) 97.4 F (36.3 C) (Temporal)   Ht 5\' 8"  (1.727 m)   Wt 173 lb (78.5 kg)   LMP 11/25/2001   BMI 26.30 kg/m   Height:   Height: 5\' 8"  (172.7 cm)  Ht Readings from Last 3 Encounters:  06/28/19 5\' 8"  (1.727 m)  12/18/18 5' 8.5" (1.74 m)  03/27/18 5' 8.75" (1.746 m)    General appearance: alert, cooperative and appears stated age Head: Normocephalic, without obvious abnormality, atraumatic Neck: no adenopathy, supple, symmetrical, trachea midline and thyroid normal to inspection and palpation Lungs: clear to auscultation bilaterally Breasts: normal appearance, no masses or tenderness Heart: regular rate and rhythm Abdomen: soft, non-tender; bowel sounds normal;  no masses,  no organomegaly Extremities: extremities normal, atraumatic, no cyanosis or edema Skin: Skin color, texture, turgor normal. No rashes or lesions Lymph nodes: Cervical, supraclavicular, and axillary nodes normal. No abnormal inguinal nodes palpated Neurologic: Grossly normal   Pelvic: External genitalia:  no lesions              Urethra:  normal appearing urethra with no masses, tenderness or lesions              Bartholins and Skenes: normal                 Vagina: normal appearing vagina with normal color and discharge, no lesions              Cervix: no lesions              Pap taken: No. Bimanual Exam:  Uterus:  normal size, contour, position, consistency, mobility, non-tender              Adnexa: normal adnexa and no mass, fullness, tenderness               Rectovaginal: Confirms               Anus:  normal sphincter tone, no  lesions  Chaperone was present for exam.  A:  Well Woman with normal exam PMP, no HRT H/o vertebral compression fracture Family hx of colon cancer in her mother Parkinson's d/o  P:   Mammogram guidelines reviewed.  She is aware this is due. Colonoscopy referral made BMD release of records signed today Information given about possible  pap smear neg 2019.  Not indicated today RF Acyclovir 200mg  bid.  #180/4RF Return annually or prn

## 2019-07-02 ENCOUNTER — Ambulatory Visit: Payer: Medicare HMO | Admitting: Neurology

## 2019-07-16 ENCOUNTER — Telehealth: Payer: Self-pay | Admitting: Obstetrics & Gynecology

## 2019-07-16 NOTE — Telephone Encounter (Signed)
Eileen Cook was scheduled 07/07/19 with Dr. Watt Climes, but canceled the appointment and did not reschedule. Referral closed

## 2019-07-27 NOTE — Progress Notes (Signed)
Virtual Visit via Video Note The purpose of this virtual visit is to provide medical care while limiting exposure to the novel coronavirus.    Consent was obtained for video visit:  Yes.   Answered questions that patient had about telehealth interaction:  Yes.   I discussed the limitations, risks, security and privacy concerns of performing an evaluation and management service by telemedicine. I also discussed with the patient that there may be a patient responsible charge related to this service. The patient expressed understanding and agreed to proceed.  Pt location: Home Physician Location: home Name of referring provider:  Crist Infante, MD I connected with Eileen Cook at patients initiation/request on 07/28/2019 at  9:45 AM EDT by video enabled telemedicine application and verified that I am speaking with the correct person using two identifiers. Pt MRN:  [id number] Pt DOB:  1951/06/08 Video Participants:  Eileen Cook;     History of Present Illness:  Patient is seen today in follow-up for Parkinson's disease.  She is on no medication, by her choice.   She is feeling a bit weak.   Pt denies falls.  Pt denies lightheadedness, near syncope.  No hallucinations.  Mood has been good.  Medical records have been reviewed since our last visit.  She just saw her gynecologist on August 3.  States that her gynecologist told her that there was a med on the market that would slow/stop disease progression.  She asks me about this today.  She also sold her home and moved into an apartment and is moving into another home in November.   Current Outpatient Medications on File Prior to Visit  Medication Sig Dispense Refill  . acyclovir (ZOVIRAX) 200 MG capsule Take 1 capsule (200 mg total) by mouth 2 (two) times daily. (Patient taking differently: Take 200 mg by mouth daily. ) 180 capsule 4  . Calcium Carbonate-Vit D-Min (CALCIUM 1200 PO) Take by mouth.    . cholecalciferol (VITAMIN D) 1000 units tablet Take  2,000 Units by mouth daily.    . Multiple Vitamin (MULTIVITAMIN) tablet Take 1 tablet by mouth daily.    . simvastatin (ZOCOR) 20 MG tablet Take 1 tablet by mouth daily.    . vitamin C (ASCORBIC ACID) 250 MG tablet Take 250 mg by mouth daily.     No current facility-administered medications on file prior to visit.      Observations/Objective:   Vitals:   07/28/19 0833  Weight: 171 lb (77.6 kg)  Height: 5\' 8"  (1.727 m)   GEN:  The patient appears stated age and is in NAD.  Neurological examination:  Orientation: The patient is alert and oriented x3. Cranial nerves: There is good facial symmetry. There is no facial hypomimia.  The speech is fluent and clear. Soft palate rises symmetrically and there is no tongue deviation. Hearing is intact to conversational tone. Motor: Strength is at least antigravity x 4.   Shoulder shrug is equal and symmetric.  There is no pronator drift.  Movement examination: Tone: unable Abnormal movements: There is mild rest tremor on the left. Coordination:  There is  decremation with RAM's, with finger taps on the left Gait and Station: Was difficult to see as she was walking with a very small space, but ambulation appeared to be good.    Assessment and Plan:   1.  Parkinson's disease, formally diagnosed January, 2020  -very long discussion today regarding medication.  Discussed medication in detail along with r/b/se.  She ultimately decided  she didn't want medication.  -she and I discussed value of safe, CV exercise  -discussed that there is no med that slows PD  -We will have my social worker call her back to dance program.  2.  Low back pain  -Following with Dr. Sherwood Gambler.  She has many questions about pain related to Parkinson's disease, and I told her that this was generally a separate issue and Parkinson's was not usually painful.   Follow Up Instructions:  5 months  -I discussed the assessment and treatment plan with the patient. The patient  was provided an opportunity to ask questions and all were answered. The patient agreed with the plan and demonstrated an understanding of the instructions.   The patient was advised to call back or seek an in-person evaluation if the symptoms worsen or if the condition fails to improve as anticipated.    Total Time spent in visit with the patient was: 25 minutes, of which more than 50% of the time was spent in counseling.   Pt understands and agrees with the plan of care outlined.     Alonza Bogus, DO

## 2019-07-28 ENCOUNTER — Telehealth (INDEPENDENT_AMBULATORY_CARE_PROVIDER_SITE_OTHER): Payer: Medicare HMO | Admitting: Neurology

## 2019-07-28 ENCOUNTER — Encounter: Payer: Self-pay | Admitting: Neurology

## 2019-07-28 ENCOUNTER — Other Ambulatory Visit: Payer: Self-pay

## 2019-07-28 VITALS — Ht 68.0 in | Wt 171.0 lb

## 2019-07-28 DIAGNOSIS — G20 Parkinson's disease: Secondary | ICD-10-CM | POA: Diagnosis not present

## 2019-07-28 DIAGNOSIS — M545 Low back pain, unspecified: Secondary | ICD-10-CM

## 2019-07-29 ENCOUNTER — Telehealth: Payer: Self-pay | Admitting: Clinical

## 2019-07-29 NOTE — Telephone Encounter (Signed)
LCSW LVM & emailed pt with Parkinson's Dance class info

## 2019-08-07 DIAGNOSIS — Z23 Encounter for immunization: Secondary | ICD-10-CM | POA: Diagnosis not present

## 2019-10-26 ENCOUNTER — Other Ambulatory Visit: Payer: Self-pay | Admitting: Obstetrics & Gynecology

## 2019-12-17 ENCOUNTER — Ambulatory Visit: Payer: Medicare HMO | Attending: Internal Medicine

## 2019-12-17 DIAGNOSIS — Z23 Encounter for immunization: Secondary | ICD-10-CM | POA: Insufficient documentation

## 2019-12-17 NOTE — Progress Notes (Signed)
   Covid-19 Vaccination Clinic  Name:  Eileen Cook    MRN: [id number] DOB: [birth year]  12/17/2019  [name] was observed post Covid-19 immunization for 15 minutes without incidence. She was provided with Vaccine Information Sheet and instruction to access the V-Safe system.   [name] was instructed to call 911 with any severe reactions post vaccine: Marland Kitchen Difficulty breathing  . Swelling of your face and throat  . A fast heartbeat  . A bad rash all over your body  . Dizziness and weakness    Immunizations Administered    Name Date Dose VIS Date Route   Pfizer COVID-19 Vaccine 12/17/2019  8:50 AM 0.3 mL 11/05/2019 Intramuscular   Manufacturer: [place]   Lot: [id number]   [place]: [id number]

## 2019-12-29 ENCOUNTER — Ambulatory Visit: Payer: Medicare HMO

## 2019-12-31 ENCOUNTER — Ambulatory Visit: Payer: Medicare HMO | Admitting: Neurology

## 2020-01-07 ENCOUNTER — Ambulatory Visit: Payer: Medicare HMO | Attending: Internal Medicine

## 2020-01-07 DIAGNOSIS — Z23 Encounter for immunization: Secondary | ICD-10-CM | POA: Insufficient documentation

## 2020-01-07 NOTE — Progress Notes (Signed)
   Covid-19 Vaccination Clinic  Name:  Sherye Janis    MRN: [id number] DOB: 05-08-1951  01/07/2020  Ms. Borey was observed post Covid-19 immunization for 15 minutes without incidence. She was provided with Vaccine Information Sheet and instruction to access the V-Safe system.   Ms. Fabricius was instructed to call 911 with any severe reactions post vaccine: Marland Kitchen Difficulty breathing  . Swelling of your face and throat  . A fast heartbeat  . A bad rash all over your body  . Dizziness and weakness    Immunizations Administered    Name Date Dose VIS Date Route   Pfizer COVID-19 Vaccine 01/07/2020  9:02 AM 0.3 mL 11/05/2019 Intramuscular   Manufacturer: [place]   Lot: [id number]   [place]: [id number]

## 2020-02-07 ENCOUNTER — Ambulatory Visit: Payer: Medicare HMO | Admitting: Neurology

## 2020-02-29 ENCOUNTER — Other Ambulatory Visit: Payer: Self-pay | Admitting: Internal Medicine

## 2020-02-29 DIAGNOSIS — Z1231 Encounter for screening mammogram for malignant neoplasm of breast: Secondary | ICD-10-CM

## 2020-03-03 ENCOUNTER — Other Ambulatory Visit: Payer: Self-pay | Admitting: Internal Medicine

## 2020-03-03 DIAGNOSIS — Z1231 Encounter for screening mammogram for malignant neoplasm of breast: Secondary | ICD-10-CM

## 2020-03-10 ENCOUNTER — Ambulatory Visit: Payer: Medicare HMO

## 2020-03-13 NOTE — Progress Notes (Signed)
Assessment/Plan:   1.  Parkinsons Disease, with diagnosis in January, 2020  -Discussed options for treatment.  Ultimately, she feels that she is really doing well and opted to hold on medication.  She asked me about medication options and we discussed those.  Those were written out for her as well, as she wanted to research those.  -Talked about the importance of safe, cardiovascular exercise.   2.  Chronic low back pain  -Follows with Dr. Sherwood Gambler, but he is retiring.  She asks me for a name of someone else to follow-up with.  I gave her the name of Dr. Vertell Limber since he also does the Parkinson surgery, just in case she ever needs that in the future. Subjective:   Keyoka Kunkle was seen today in follow up for Parkinsons disease.  My previous records were reviewed prior to todays visit as well as outside records available to me. Pt denies falls.  Pt denies lightheadedness, near syncope.  No hallucinations.  Mood has been good despite the fact that she has been moving.  One of her sons is getting married soon.  The other is doing in vitro fertilization.  She is not exercising as much.  She is having more low back pain.  States that Dr. Rita Ohara is retiring so needs to find someone new in his practice to take over her care.  Her mother did die since our last visit.  She did go to [place] for the funeral.  Current prescribed movement disorder medications: None   PREVIOUS MEDICATIONS: none to date  ALLERGIES:   Allergies  Allergen Reactions  . Codeine     CURRENT MEDICATIONS:  Outpatient Encounter Medications as of 03/14/2020  Medication Sig  . acyclovir (ZOVIRAX) 200 MG capsule Take 1 capsule (200 mg total) by mouth 2 (two) times daily. (Patient taking differently: Take 200 mg by mouth daily. )  . [place]-Glucosamine-Vit D (GLUCOSAMINE COMPLEX PO) Take 1 tablet by mouth daily.  . Calcium Carbonate-Vit D-Min (CALCIUM 1200 PO) Take 1 tablet by mouth daily.   . cholecalciferol (VITAMIN D) 1000  units tablet Take 2,000 Units by mouth daily.  . Multiple Vitamin (MULTIVITAMIN) tablet Take 1 tablet by mouth daily.  . simvastatin (ZOCOR) 20 MG tablet Take 1 tablet by mouth daily.  . vitamin C (ASCORBIC ACID) 250 MG tablet Take 250 mg by mouth daily.   No facility-administered encounter medications on file as of 03/14/2020.    Objective:   PHYSICAL EXAMINATION:    VITALS:   Vitals:   03/14/20 1126  BP: (!) 145/85  Pulse: 75  SpO2: 97%  Weight: 169 lb (76.7 kg)  Height: 5' 8.5" (1.74 m)    GEN:  The patient appears stated age and is in NAD. HEENT:  Normocephalic, atraumatic.  The mucous membranes are moist. The superficial temporal arteries are without ropiness or tenderness. CV:  RRR Lungs:  CTAB Neck/HEME:  There are no carotid bruits bilaterally.  Neurological examination:  Orientation: The patient is alert and oriented x3. Cranial nerves: There is good facial symmetry with minimal facial hypomimia. The speech is fluent and clear. Soft palate rises symmetrically and there is no tongue deviation. Hearing is intact to conversational tone. Sensation: Sensation is intact to light touch throughout Motor: Strength is at least antigravity x4.  Movement examination: Tone: There is mild increased tone in the right lower extremity only Abnormal movements: There is mild and intermittent right greater than left upper extremity rest tremor.  It is very  intermittent. Coordination:  There is mild decremation with RAM's, with finger taps on the right Gait and Station: The patient has no difficulty arising out of a deep-seated chair without the use of the hands. The patient's stride length is good.    Total time spent on today's visit was 30 minutes, including both face-to-face time and nonface-to-face time.  Time included that spent on review of records (prior notes available to me/labs/imaging if pertinent), discussing treatment and goals, answering patient's questions and  coordinating care.  Cc:  Crist Infante, MD

## 2020-03-14 ENCOUNTER — Other Ambulatory Visit: Payer: Self-pay

## 2020-03-14 ENCOUNTER — Ambulatory Visit: Payer: Medicare HMO | Admitting: Neurology

## 2020-03-14 ENCOUNTER — Encounter: Payer: Self-pay | Admitting: Neurology

## 2020-03-14 VITALS — BP 145/85 | HR 75 | Ht 68.5 in | Wt 169.0 lb

## 2020-03-14 DIAGNOSIS — Z0000 Encounter for general adult medical examination without abnormal findings: Secondary | ICD-10-CM | POA: Diagnosis not present

## 2020-03-14 DIAGNOSIS — M859 Disorder of bone density and structure, unspecified: Secondary | ICD-10-CM | POA: Diagnosis not present

## 2020-03-14 DIAGNOSIS — G20 Parkinson's disease: Secondary | ICD-10-CM

## 2020-03-14 DIAGNOSIS — E7849 Other hyperlipidemia: Secondary | ICD-10-CM | POA: Diagnosis not present

## 2020-03-14 NOTE — Patient Instructions (Signed)
Medication considerations for the future:  carbidopa/levodopa 25/100  Pramipexole  Call [facility] and get an appt with Dr. Vertell Limber  The physicians and staff at [facility] are committed to providing excellent care. You may receive a survey requesting feedback about your experience at our office. We strive to receive "very good" responses to the survey questions. If you feel that your experience would prevent you from giving the office a "very good " response, please contact our office to try to remedy the situation. We may be reached at [phone]. Thank you for taking the time out of your busy day to complete the survey.

## 2020-03-21 DIAGNOSIS — E785 Hyperlipidemia, unspecified: Secondary | ICD-10-CM | POA: Diagnosis not present

## 2020-03-21 DIAGNOSIS — G20 Parkinson's disease: Secondary | ICD-10-CM | POA: Diagnosis not present

## 2020-03-21 DIAGNOSIS — I700 Atherosclerosis of aorta: Secondary | ICD-10-CM | POA: Diagnosis not present

## 2020-03-21 DIAGNOSIS — R030 Elevated blood-pressure reading, without diagnosis of hypertension: Secondary | ICD-10-CM | POA: Diagnosis not present

## 2020-03-21 DIAGNOSIS — Z23 Encounter for immunization: Secondary | ICD-10-CM | POA: Diagnosis not present

## 2020-03-21 DIAGNOSIS — M545 Low back pain: Secondary | ICD-10-CM | POA: Diagnosis not present

## 2020-03-21 DIAGNOSIS — Z0000 Encounter for general adult medical examination without abnormal findings: Secondary | ICD-10-CM | POA: Diagnosis not present

## 2020-03-21 DIAGNOSIS — Z1331 Encounter for screening for depression: Secondary | ICD-10-CM | POA: Diagnosis not present

## 2020-03-21 DIAGNOSIS — R82998 Other abnormal findings in urine: Secondary | ICD-10-CM | POA: Diagnosis not present

## 2020-03-21 DIAGNOSIS — R251 Tremor, unspecified: Secondary | ICD-10-CM | POA: Diagnosis not present

## 2020-03-21 DIAGNOSIS — R011 Cardiac murmur, unspecified: Secondary | ICD-10-CM | POA: Diagnosis not present

## 2020-03-21 DIAGNOSIS — E559 Vitamin D deficiency, unspecified: Secondary | ICD-10-CM | POA: Diagnosis not present

## 2020-03-21 DIAGNOSIS — M4850XS Collapsed vertebra, not elsewhere classified, site unspecified, sequela of fracture: Secondary | ICD-10-CM | POA: Diagnosis not present

## 2020-03-24 ENCOUNTER — Other Ambulatory Visit: Payer: Self-pay | Admitting: Internal Medicine

## 2020-03-24 DIAGNOSIS — M4850XS Collapsed vertebra, not elsewhere classified, site unspecified, sequela of fracture: Secondary | ICD-10-CM

## 2020-04-07 ENCOUNTER — Other Ambulatory Visit: Payer: Self-pay

## 2020-04-07 ENCOUNTER — Ambulatory Visit
Admission: RE | Admit: 2020-04-07 | Discharge: 2020-04-07 | Disposition: A | Discharge status: Home / Self Care | Payer: Medicare HMO | Source: Ambulatory Visit | Attending: Internal Medicine | Admitting: Internal Medicine

## 2020-04-07 DIAGNOSIS — Z1231 Encounter for screening mammogram for malignant neoplasm of breast: Secondary | ICD-10-CM | POA: Diagnosis not present

## 2020-04-11 ENCOUNTER — Ambulatory Visit
Admission: RE | Admit: 2020-04-11 | Discharge: 2020-04-11 | Disposition: A | Discharge status: Home / Self Care | Payer: Medicare HMO | Source: Ambulatory Visit | Attending: Internal Medicine | Admitting: Internal Medicine

## 2020-04-11 ENCOUNTER — Other Ambulatory Visit: Payer: Self-pay

## 2020-04-11 DIAGNOSIS — M48061 Spinal stenosis, lumbar region without neurogenic claudication: Secondary | ICD-10-CM | POA: Diagnosis not present

## 2020-04-11 DIAGNOSIS — M4850XS Collapsed vertebra, not elsewhere classified, site unspecified, sequela of fracture: Secondary | ICD-10-CM

## 2020-04-26 DIAGNOSIS — R030 Elevated blood-pressure reading, without diagnosis of hypertension: Secondary | ICD-10-CM | POA: Diagnosis not present

## 2020-04-26 DIAGNOSIS — M5412 Radiculopathy, cervical region: Secondary | ICD-10-CM | POA: Diagnosis not present

## 2020-04-26 DIAGNOSIS — M48062 Spinal stenosis, lumbar region with neurogenic claudication: Secondary | ICD-10-CM | POA: Diagnosis not present

## 2020-04-26 DIAGNOSIS — M5416 Radiculopathy, lumbar region: Secondary | ICD-10-CM | POA: Diagnosis not present

## 2020-04-26 DIAGNOSIS — Z6825 Body mass index (BMI) 25.0-25.9, adult: Secondary | ICD-10-CM | POA: Diagnosis not present

## 2020-04-26 DIAGNOSIS — M4316 Spondylolisthesis, lumbar region: Secondary | ICD-10-CM | POA: Diagnosis not present

## 2020-04-26 DIAGNOSIS — M5136 Other intervertebral disc degeneration, lumbar region: Secondary | ICD-10-CM | POA: Diagnosis not present

## 2020-06-09 ENCOUNTER — Encounter: Payer: Self-pay | Admitting: Obstetrics & Gynecology

## 2020-06-09 DIAGNOSIS — Z800 Family history of malignant neoplasm of digestive organs: Secondary | ICD-10-CM | POA: Diagnosis not present

## 2020-06-09 DIAGNOSIS — K5730 Diverticulosis of large intestine without perforation or abscess without bleeding: Secondary | ICD-10-CM | POA: Diagnosis not present

## 2020-07-08 ENCOUNTER — Other Ambulatory Visit: Payer: Self-pay | Admitting: Obstetrics & Gynecology

## 2020-07-10 NOTE — Telephone Encounter (Signed)
Medication refill request: Acyclovir Last AEX:  06/28/19 SM Next AEX: 09/12/20 Last MMG (if hormonal medication request): 04/07/20 BIRADS 1 negative/density b Refill authorized: Today, please advise

## 2020-08-01 DIAGNOSIS — M48062 Spinal stenosis, lumbar region with neurogenic claudication: Secondary | ICD-10-CM | POA: Diagnosis not present

## 2020-08-01 DIAGNOSIS — M4316 Spondylolisthesis, lumbar region: Secondary | ICD-10-CM | POA: Diagnosis not present

## 2020-08-26 DIAGNOSIS — Z23 Encounter for immunization: Secondary | ICD-10-CM | POA: Diagnosis not present

## 2020-08-30 DIAGNOSIS — M5136 Other intervertebral disc degeneration, lumbar region: Secondary | ICD-10-CM | POA: Diagnosis not present

## 2020-08-30 DIAGNOSIS — M48062 Spinal stenosis, lumbar region with neurogenic claudication: Secondary | ICD-10-CM | POA: Diagnosis not present

## 2020-08-30 DIAGNOSIS — M5416 Radiculopathy, lumbar region: Secondary | ICD-10-CM | POA: Diagnosis not present

## 2020-08-30 DIAGNOSIS — M4316 Spondylolisthesis, lumbar region: Secondary | ICD-10-CM | POA: Diagnosis not present

## 2020-09-11 NOTE — Progress Notes (Signed)
69 y.o. G3P2 Divorced White or Caucasian female here for breast and pelvic exam.  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  Takes acyclovir.  Denies vaginal bleeding.  Son got married in August in [place].  Everything went well.    She was diagnosed with parkinson's and wants to consider seeing specialist.    She's been having some low back issues that past few years.  She has a compression fracture and some nerve injury.  Seeing Dr. Vertell Limber.  Having some injections.  Has physical therapy ordered.    Patient's last menstrual period was 11/25/2001.          Sexually active: No.  H/O STD:  no  Health Maintenance: PCP:  Crist Infante Last wellness appt was march or April 2021 Did blood work at that appt:  yes Vaccines are up to date:  Reviewed with pt Colonoscopy:  06-09-2020 f/u 70yrs diverticulitis MMG:  04-10-2020 Category b density birads 1:neg BMD: 01-06-2019 Last pap smear:  01-19-16 neg, 01-30-18 neg.   H/o abnormal pap smear:  no   reports that she has never smoked. She has never used smokeless tobacco. She reports current alcohol use of about 3.0 standard drinks of alcohol per week. She reports that she does not use drugs.  Past Medical History:  Diagnosis Date  . 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 Diverticulitis   . Elevated cholesterol   . HSV-2 infection   . Parkinson disease ([employer])    Dr. Carles Collet  . Spinal stenosis    sees Dr Vertell Limber    Past Surgical History:  Procedure Laterality Date  . BARTHOLIN CYST MARSUPIALIZATION  1/87  . BREAST CYST EXCISION Right 03/2017  . CATARACT EXTRACTION, BILATERAL  2019  . CESAREAN SECTION    . ROOT CANAL  2019    Current Outpatient Medications  Medication Sig Dispense Refill  . acyclovir (ZOVIRAX) 200 MG capsule TAKE ONE CAPSULE BY MOUTH TWICE DAILY. 180 capsule 0  . Boswell[place]-Glucosamine-Vit D (GLUCOSAMINE COMPLEX PO) Take 1 tablet by mouth daily.    . Calcium Carbonate-Vit D-Min (CALCIUM 1200 PO) Take 1 tablet by mouth daily.     .  cholecalciferol (VITAMIN D) 1000 units tablet Take 2,000 Units by mouth daily.    . Multiple Vitamin (MULTIVITAMIN) tablet Take 1 tablet by mouth daily.    . simvastatin (ZOCOR) 20 MG tablet Take 1 tablet by mouth daily.    . vitamin C (ASCORBIC ACID) 250 MG tablet Take 250 mg by mouth daily.     No current facility-administered medications for this visit.    Family History  Problem Relation Age of Onset  . Colon cancer Mother 23  . Colon cancer Maternal Grandmother   . Esophageal cancer Father   . Hypertension Sister        #1  . Lung cancer Sister   . Mitral valve prolapse Sister        #2    Review of Systems  Constitutional: Negative.   HENT: Negative.   Eyes: Negative.   Respiratory: Negative.   Cardiovascular: Negative.   Gastrointestinal: Negative.   Endocrine: Negative.   Genitourinary:       Burning pain in groin area occasional  Musculoskeletal: Negative.   Skin: Negative.   Allergic/Immunologic: Negative.   Neurological: Negative.   Hematological: Negative.   Psychiatric/Behavioral: Negative.     Exam:   BP 118/78   Pulse 70   Resp 16   Ht 5\' 8"  (1.727  m)   Wt 168 lb (76.2 kg)   LMP 11/25/2001   BMI 25.54 kg/m   Height: 5\' 8"  (172.7 cm)  General appearance: alert, cooperative and appears stated age Breasts: normal appearance, no masses or tenderness Abdomen: soft, non-tender; bowel sounds normal; no masses,  no organomegaly Lymph nodes: Cervical, supraclavicular, and axillary nodes normal.  No abnormal inguinal nodes palpated Neurologic: Grossly normal  Pelvic: External genitalia:  no lesions              Urethra:  normal appearing urethra with no masses, tenderness or lesions              Bartholins and Skenes: normal                 Vagina: normal appearing vagina with normal color and discharge, no lesions              Cervix: no lesions              Pap taken: No. Bimanual Exam:  Uterus:  normal size, contour, position, consistency, mobility,  non-tender              Adnexa: normal adnexa and no mass, fullness, tenderness               Rectovaginal: Confirms               Anus:  normal sphincter tone, no lesions  Chaperone, Royal Hawthorn, CMA, was present for exam.  A:  Breast and Pelvic exam PMP, no HRT H/o vertebral compression fracture followed by Dr. Vertell Limber Family hx of colon cancer in her mother Parkinson's d/o HSV 2  P:   Mammogram guidelines reviewed pap smear not indicated Colonoscopy due again in 5 years Acyclovir 200mg  bid RF completed.  #180/3RF Vaccines are UTD except for her Covid booster.  Is waiting until past this next weekend to have it done in case has reaction. return annually or prn  28 minutes of total time was spent for this patient encounter, including preparation, face-to-face counseling with the patient and coordination of care, and documentation of the encounter.

## 2020-09-12 ENCOUNTER — Encounter: Payer: Self-pay | Admitting: Obstetrics & Gynecology

## 2020-09-12 ENCOUNTER — Other Ambulatory Visit: Payer: Self-pay

## 2020-09-12 ENCOUNTER — Ambulatory Visit (INDEPENDENT_AMBULATORY_CARE_PROVIDER_SITE_OTHER): Payer: Medicare HMO | Admitting: Obstetrics & Gynecology

## 2020-09-12 VITALS — BP 118/78 | HR 70 | Resp 16 | Ht 68.0 in | Wt 168.0 lb

## 2020-09-12 DIAGNOSIS — Z01419 Encounter for gynecological examination (general) (routine) without abnormal findings: Secondary | ICD-10-CM

## 2020-09-12 DIAGNOSIS — Z9289 Personal history of other medical treatment: Secondary | ICD-10-CM

## 2020-09-12 MED ORDER — ACYCLOVIR 200 MG PO CAPS: 200.0000 mg | ORAL_CAPSULE | Freq: Two times a day (BID) | ORAL | 3 refills | Status: DC

## 2020-09-12 NOTE — Patient Instructions (Addendum)
Eldridge Abrahams, [facility] [address], [place] [address]  [phone])

## 2020-09-13 NOTE — Progress Notes (Signed)
Assessment/Plan:   1.  Parkinsons Disease, with diagnosis in January, 2020  -discussed medication in detail and she decided to hold on that  -increase exercise  -We discussed that it used to be thought that levodopa would increase risk of melanoma but now it is believed that Parkinsons itself likely increases risk of melanoma. she is to get regular skin checks.   2.  Chronic low back pain  -following with neurosx/[facility].  Shots helped.   Subjective:   Eileen Cook was seen today in follow up for Parkinsons disease.  My previous records were reviewed prior to todays visit as well as outside records available to me.  She is noting more tremor.  One fall - was carrying a box outside and tripped over concrete.  Fortunately she didn't get hurt.  Pt denies lightheadedness, near syncope.  No hallucinations.  Mood has been good.  Not on any Parkinsons Disease meds.  Saw Dr. Vertell Limber - shots in back helped.  Getting ready to start PT  Current prescribed movement disorder medications: None   PREVIOUS MEDICATIONS: none to date  ALLERGIES:   Allergies  Allergen Reactions  . Codeine     CURRENT MEDICATIONS:  Outpatient Encounter Medications as of 09/15/2020  Medication Sig  . acyclovir (ZOVIRAX) 200 MG capsule Take 1 capsule (200 mg total) by mouth 2 (two) times daily. (Patient taking differently: Take 200 mg by mouth 2 (two) times daily. Take once a day most days)  . [place]-Glucosamine-Vit D (GLUCOSAMINE COMPLEX PO) Take 1 tablet by mouth daily.  . Calcium Carbonate-Vit D-Min (CALCIUM 1200 PO) Take 1 tablet by mouth daily.   . cholecalciferol (VITAMIN D) 1000 units tablet Take 2,000 Units by mouth daily.  . Multiple Vitamin (MULTIVITAMIN) tablet Take 1 tablet by mouth daily.  . simvastatin (ZOCOR) 20 MG tablet Take 1 tablet by mouth daily.  . vitamin C (ASCORBIC ACID) 250 MG tablet Take 250 mg by mouth daily.   No facility-administered encounter medications on file as of 09/15/2020.     Objective:   PHYSICAL EXAMINATION:    VITALS:   Vitals:   09/15/20 1544  BP: 132/87  Pulse: 88  SpO2: 96%  Weight: 170 lb (77.1 kg)  Height: 5' 8.25" (1.734 m)    GEN:  The patient appears stated age and is in NAD. HEENT:  Normocephalic, atraumatic.  The mucous membranes are moist. The superficial temporal arteries are without ropiness or tenderness. CV:  RRR Lungs:  CTAB Neck/HEME:  There are no carotid bruits bilaterally.  Neurological examination:  Orientation: The patient is alert and oriented x3. Cranial nerves: There is good facial symmetry with minimal facial hypomimia. The speech is fluent and clear. Soft palate rises symmetrically and there is no tongue deviation. Hearing is intact to conversational tone. Sensation: Sensation is intact to light touch throughout Motor: Strength is at least antigravity x4.  Movement examination: Tone: There is mild increased tone in the right lower extremity and LLE  Abnormal movements: There is mild and intermittent right greater than left upper extremity rest tremor.  It is very intermittent. Coordination:  There is mild decremation with finger taps bilaterally Gait and Station: The patient has no difficulty arising out of a deep-seated chair without the use of the hands. The patient's stride length is good but she slightly drags the LLE (mild)   Total time spent on today's visit was 30 minutes, including both face-to-face time and nonface-to-face time.  Time included that spent on review of  records (prior notes available to me/labs/imaging if pertinent), discussing treatment and goals, answering patient's questions and coordinating care.  Cc:  Crist Infante, MD

## 2020-09-15 ENCOUNTER — Encounter: Payer: Self-pay | Admitting: Neurology

## 2020-09-15 ENCOUNTER — Ambulatory Visit: Payer: Medicare HMO | Admitting: Neurology

## 2020-09-15 ENCOUNTER — Other Ambulatory Visit: Payer: Self-pay

## 2020-09-15 VITALS — BP 132/87 | HR 88 | Ht 68.25 in | Wt 170.0 lb

## 2020-09-15 DIAGNOSIS — G20 Parkinson's disease: Secondary | ICD-10-CM | POA: Diagnosis not present

## 2020-09-15 NOTE — Patient Instructions (Signed)
The physicians and staff at [facility] are committed to providing excellent care. You may receive a survey requesting feedback about your experience at our office. We strive to receive "very good" responses to the survey questions. If you feel that your experience would prevent you from giving the office a "very good " response, please contact our office to try to remedy the situation. We may be reached at [phone]. Thank you for taking the time out of your busy day to complete the survey.  

## 2020-09-22 ENCOUNTER — Ambulatory Visit: Payer: Medicare HMO | Admitting: Neurology

## 2020-09-27 DIAGNOSIS — L821 Other seborrheic keratosis: Secondary | ICD-10-CM | POA: Diagnosis not present

## 2020-09-27 DIAGNOSIS — L578 Other skin changes due to chronic exposure to nonionizing radiation: Secondary | ICD-10-CM | POA: Diagnosis not present

## 2020-09-27 DIAGNOSIS — L723 Sebaceous cyst: Secondary | ICD-10-CM | POA: Diagnosis not present

## 2020-09-27 DIAGNOSIS — L570 Actinic keratosis: Secondary | ICD-10-CM | POA: Diagnosis not present

## 2020-09-29 DIAGNOSIS — M4316 Spondylolisthesis, lumbar region: Secondary | ICD-10-CM | POA: Diagnosis not present

## 2020-09-29 DIAGNOSIS — M5416 Radiculopathy, lumbar region: Secondary | ICD-10-CM | POA: Diagnosis not present

## 2020-10-18 DIAGNOSIS — M5416 Radiculopathy, lumbar region: Secondary | ICD-10-CM | POA: Diagnosis not present

## 2020-10-27 DIAGNOSIS — M5416 Radiculopathy, lumbar region: Secondary | ICD-10-CM | POA: Diagnosis not present

## 2020-12-27 DIAGNOSIS — M5481 Occipital neuralgia: Secondary | ICD-10-CM | POA: Diagnosis not present

## 2020-12-27 DIAGNOSIS — M4722 Other spondylosis with radiculopathy, cervical region: Secondary | ICD-10-CM | POA: Diagnosis not present

## 2020-12-27 DIAGNOSIS — M47812 Spondylosis without myelopathy or radiculopathy, cervical region: Secondary | ICD-10-CM | POA: Diagnosis not present

## 2020-12-27 DIAGNOSIS — G20 Parkinson's disease: Secondary | ICD-10-CM | POA: Diagnosis not present

## 2020-12-27 DIAGNOSIS — Z6825 Body mass index (BMI) 25.0-25.9, adult: Secondary | ICD-10-CM | POA: Diagnosis not present

## 2020-12-27 DIAGNOSIS — R030 Elevated blood-pressure reading, without diagnosis of hypertension: Secondary | ICD-10-CM | POA: Diagnosis not present

## 2021-01-05 DIAGNOSIS — M542 Cervicalgia: Secondary | ICD-10-CM | POA: Diagnosis not present

## 2021-01-19 DIAGNOSIS — M542 Cervicalgia: Secondary | ICD-10-CM | POA: Diagnosis not present

## 2021-01-24 DIAGNOSIS — H52203 Unspecified astigmatism, bilateral: Secondary | ICD-10-CM | POA: Diagnosis not present

## 2021-01-24 DIAGNOSIS — H53001 Unspecified amblyopia, right eye: Secondary | ICD-10-CM | POA: Diagnosis not present

## 2021-01-24 DIAGNOSIS — H16213 Exposure keratoconjunctivitis, bilateral: Secondary | ICD-10-CM | POA: Diagnosis not present

## 2021-01-24 DIAGNOSIS — H43813 Vitreous degeneration, bilateral: Secondary | ICD-10-CM | POA: Diagnosis not present

## 2021-01-26 DIAGNOSIS — M542 Cervicalgia: Secondary | ICD-10-CM | POA: Diagnosis not present

## 2021-02-02 DIAGNOSIS — M542 Cervicalgia: Secondary | ICD-10-CM | POA: Diagnosis not present

## 2021-02-02 DIAGNOSIS — M5416 Radiculopathy, lumbar region: Secondary | ICD-10-CM | POA: Diagnosis not present

## 2021-02-25 DIAGNOSIS — T1511XA Foreign body in conjunctival sac, right eye, initial encounter: Secondary | ICD-10-CM | POA: Diagnosis not present

## 2021-02-27 DIAGNOSIS — H1132 Conjunctival hemorrhage, left eye: Secondary | ICD-10-CM | POA: Diagnosis not present

## 2021-03-20 DIAGNOSIS — E785 Hyperlipidemia, unspecified: Secondary | ICD-10-CM | POA: Diagnosis not present

## 2021-03-20 DIAGNOSIS — M8589 Other specified disorders of bone density and structure, multiple sites: Secondary | ICD-10-CM | POA: Diagnosis not present

## 2021-03-20 DIAGNOSIS — E559 Vitamin D deficiency, unspecified: Secondary | ICD-10-CM | POA: Diagnosis not present

## 2021-03-22 NOTE — Progress Notes (Signed)
Assessment/Plan:   1.  Parkinsons Disease, with diagnosis in January, 2020  -Discussed new AAN guidelines patients should be on levodopa early.  She asked a lot of questions and I answered those to the best of my ability.  We talked extensively about risks, benefits, and side effects, including but not limited to dyskinesia.  She is willing to try.  Start Carbidopa Levodopa as follows:  Take 1/2 tablet three times daily, at least 30 minutes before meals (approximately 7am/11am/4pm), for one week  Then take 1/2 tablet in the morning, 1/2 tablet in the afternoon, 1 tablet in the evening, at least 30 minutes before meals, for one week  Then take 1/2 tablet in the morning, 1 tablet in the afternoon, 1 tablet in the evening, at least 30 minutes before meals, for one week  Then take 1 tablet three times daily at 7am/11am/4pm, at least 30 minutes before meals   As a reminder, carbidopa/levodopa can be taken at the same time as a carbohydrate, but we like to have you take your pill either 30 minutes before a protein source or 1 hour after as protein can interfere with carbidopa/levodopa absorption.  -She met with my LCSW today.   2.  Chronic low back pain  -following with neurosx/[facility].  In PT now. Subjective:   Eileen Cook was seen today in follow up for Parkinsons disease.  My previous records were reviewed prior to todays visit as well as outside records available to me.  Pt denies falls.  Pt denies lightheadedness, near syncope.  No hallucinations.  Mood has been good.  She is exercising.  Currently in PT for the back.    Current prescribed movement disorder medications: None   PREVIOUS MEDICATIONS: none to date  ALLERGIES:   Allergies  Allergen Reactions  . Codeine     CURRENT MEDICATIONS:  Outpatient Encounter Medications as of 03/23/2021  Medication Sig  . acyclovir (ZOVIRAX) 200 MG capsule Take 1 capsule (200 mg total) by mouth 2 (two) times daily. (Patient taking  differently: Take 200 mg by mouth 2 (two) times daily. Take once a day most days)  . Calcium Carbonate-Vit D-Min (CALCIUM 1200 PO) Take 1 tablet by mouth daily.   . cholecalciferol (VITAMIN D) 1000 units tablet Take 2,000 Units by mouth daily.  . Multiple Vitamin (MULTIVITAMIN) tablet Take 1 tablet by mouth daily.  . simvastatin (ZOCOR) 20 MG tablet Take 1 tablet by mouth daily.  . vitamin C (ASCORBIC ACID) 250 MG tablet Take 250 mg by mouth daily.  . Boswellia-Glucosamine-Vit D (GLUCOSAMINE COMPLEX PO) Take 1 tablet by mouth daily. (Patient not taking: Reported on 03/23/2021)   No facility-administered encounter medications on file as of 03/23/2021.    Objective:   PHYSICAL EXAMINATION:    VITALS:   Vitals:   03/23/21 1506  BP: 122/82  Pulse: (!) 110  SpO2: 97%  Weight: 175 lb (79.4 kg)  Height: '5\' 8"'  (1.727 m)   Wt Readings from Last 3 Encounters:  03/23/21 175 lb (79.4 kg)  09/15/20 170 lb (77.1 kg)  09/12/20 168 lb (76.2 kg)     GEN:  The patient appears stated age and is in NAD. HEENT:  Normocephalic, atraumatic.  The mucous membranes are moist. The superficial temporal arteries are without ropiness or tenderness. CV:  Tachy.  regular Lungs:  CTAB Neck/HEME:  There are no carotid bruits bilaterally.  Neurological examination:  Orientation: The patient is alert and oriented x3. Cranial nerves: There is good facial  symmetry with minimal facial hypomimia. The speech is fluent and clear. Soft palate rises symmetrically and there is no tongue deviation. Hearing is intact to conversational tone. Sensation: Sensation is intact to light touch throughout Motor: Strength is at least antigravity x4.  Movement examination: Tone: There is nl tone in the UE Abnormal movements: There is mild and intermittent right greater than left upper extremity rest tremor.   Coordination:  There is mild decremation on the R Gait and Station: The patient has no difficulty arising out of a  deep-seated chair without the use of the hands. The patient's stride length is good with slight decreased arm swing   Total time spent on today's visit was 32 minutes, including both face-to-face time and nonface-to-face time.  Time included that spent on review of records (prior notes available to me/labs/imaging if pertinent), discussing treatment and goals, answering patient's questions and coordinating care.  Cc:  Crist Infante, MD

## 2021-03-23 ENCOUNTER — Other Ambulatory Visit: Payer: Self-pay

## 2021-03-23 ENCOUNTER — Ambulatory Visit: Payer: Medicare HMO | Admitting: Neurology

## 2021-03-23 ENCOUNTER — Encounter: Payer: Self-pay | Admitting: Neurology

## 2021-03-23 VITALS — BP 122/82 | HR 110 | Ht 68.0 in | Wt 175.0 lb

## 2021-03-23 DIAGNOSIS — M4316 Spondylolisthesis, lumbar region: Secondary | ICD-10-CM | POA: Diagnosis not present

## 2021-03-23 DIAGNOSIS — G20 Parkinson's disease: Secondary | ICD-10-CM | POA: Diagnosis not present

## 2021-03-23 DIAGNOSIS — M5416 Radiculopathy, lumbar region: Secondary | ICD-10-CM | POA: Diagnosis not present

## 2021-03-23 MED ORDER — CARBIDOPA-LEVODOPA 25-100 MG PO TABS
1.0000 | ORAL_TABLET | Freq: Three times a day (TID) | ORAL | 1 refills | Status: DC
Start: 2021-03-23 — End: 2022-05-24

## 2021-03-23 NOTE — Patient Instructions (Addendum)
Start Carbidopa Levodopa as follows: °Take 1/2 tablet three times daily, at least 30 minutes before meals (approximately 7am/11am/4pm), for one week °Then take 1/2 tablet in the morning, 1/2 tablet in the afternoon, 1 tablet in the evening, at least 30 minutes before meals, for one week °Then take 1/2 tablet in the morning, 1 tablet in the afternoon, 1 tablet in the evening, at least 30 minutes before meals, for one week °Then take 1 tablet three times daily at 7am/11am/4pm, at least 30 minutes before meals °  As a reminder, carbidopa/levodopa can be taken at the same time as a carbohydrate, but we like to have you take your pill either 30 minutes before a protein source or 1 hour after as protein can interfere with carbidopa/levodopa absorption. ° °Here are some resources/books that you may find helpful as you navigate the challenges of Parkinson's Disease ° °1.  Parkinson's treatement: 10 secrets to a happier life by Michael Okun, MD °2.  Navigating Life with Parkinsons disease by Sotirios Parashos °3.  My degeneration: A journey through Parkinsons (Peter Dunlap - [place]) °4.  Every Victory counts (I believe this one if free through [employer]) °5.  Lucky Man by Michael J Fox °6.  101 Questions & Answers about Parkinson's by Abraham Lieberman °7.  Parkinsons Disease Treatment Book by JE Ahiskog ° °

## 2021-03-28 DIAGNOSIS — R82998 Other abnormal findings in urine: Secondary | ICD-10-CM | POA: Diagnosis not present

## 2021-03-28 DIAGNOSIS — G20 Parkinson's disease: Secondary | ICD-10-CM | POA: Diagnosis not present

## 2021-03-28 DIAGNOSIS — R251 Tremor, unspecified: Secondary | ICD-10-CM | POA: Diagnosis not present

## 2021-03-28 DIAGNOSIS — Z1331 Encounter for screening for depression: Secondary | ICD-10-CM | POA: Diagnosis not present

## 2021-03-28 DIAGNOSIS — E785 Hyperlipidemia, unspecified: Secondary | ICD-10-CM | POA: Diagnosis not present

## 2021-03-28 DIAGNOSIS — M1990 Unspecified osteoarthritis, unspecified site: Secondary | ICD-10-CM | POA: Diagnosis not present

## 2021-03-28 DIAGNOSIS — Z0000 Encounter for general adult medical examination without abnormal findings: Secondary | ICD-10-CM | POA: Diagnosis not present

## 2021-03-28 DIAGNOSIS — M8580 Other specified disorders of bone density and structure, unspecified site: Secondary | ICD-10-CM | POA: Diagnosis not present

## 2021-03-28 DIAGNOSIS — R011 Cardiac murmur, unspecified: Secondary | ICD-10-CM | POA: Diagnosis not present

## 2021-03-28 DIAGNOSIS — I700 Atherosclerosis of aorta: Secondary | ICD-10-CM | POA: Diagnosis not present

## 2021-03-28 DIAGNOSIS — R030 Elevated blood-pressure reading, without diagnosis of hypertension: Secondary | ICD-10-CM | POA: Diagnosis not present

## 2021-03-30 DIAGNOSIS — Z1212 Encounter for screening for malignant neoplasm of rectum: Secondary | ICD-10-CM | POA: Diagnosis not present

## 2021-04-04 DIAGNOSIS — M5416 Radiculopathy, lumbar region: Secondary | ICD-10-CM | POA: Diagnosis not present

## 2021-04-13 DIAGNOSIS — M5416 Radiculopathy, lumbar region: Secondary | ICD-10-CM | POA: Diagnosis not present

## 2021-05-01 ENCOUNTER — Telehealth (HOSPITAL_BASED_OUTPATIENT_CLINIC_OR_DEPARTMENT_OTHER): Payer: Self-pay

## 2021-05-01 NOTE — Telephone Encounter (Signed)
Patient called today to inquire about a neurologist that you recommended to her. She states that it was someone in [place]. She can't remember who it was. Please advise. tbw

## 2021-05-02 NOTE — Telephone Encounter (Signed)
It is Dr. Eldridge Abrahams, neurologist at [facility] who specializes in [place].  Phone number for appt is [phone].  It will be months out for an initial appointment but tell her they usually keep a list for earlier appointments when there are cancellations.

## 2021-05-03 NOTE — Telephone Encounter (Signed)
Called patient and left a detailed messsage about neurologist at [facility]. Reviewed patient's DPR before leaving the message. tbw

## 2021-06-03 ENCOUNTER — Other Ambulatory Visit: Payer: Self-pay | Admitting: Obstetrics & Gynecology

## 2021-06-20 DIAGNOSIS — M542 Cervicalgia: Secondary | ICD-10-CM | POA: Diagnosis not present

## 2021-06-27 DIAGNOSIS — M542 Cervicalgia: Secondary | ICD-10-CM | POA: Diagnosis not present

## 2021-07-23 ENCOUNTER — Other Ambulatory Visit (HOSPITAL_COMMUNITY): Payer: Self-pay | Admitting: Internal Medicine

## 2021-07-23 DIAGNOSIS — R011 Cardiac murmur, unspecified: Secondary | ICD-10-CM

## 2021-08-15 ENCOUNTER — Ambulatory Visit (HOSPITAL_COMMUNITY): Payer: Medicare HMO | Attending: Cardiology

## 2021-08-15 ENCOUNTER — Other Ambulatory Visit: Payer: Self-pay

## 2021-08-15 DIAGNOSIS — R011 Cardiac murmur, unspecified: Secondary | ICD-10-CM | POA: Insufficient documentation

## 2021-08-16 LAB — ECHOCARDIOGRAM COMPLETE
Area-P 1/2: 4.6 cm2
P 1/2 time: 622 msec
S' Lateral: 2.7 cm

## 2021-09-06 NOTE — Progress Notes (Signed)
Assessment/Plan:   1.  Parkinsons Disease, with diagnosis in January, 2020  -she didn't like the way that levodopa made her feel (sleepy).  We discussed at some point she will likely need levodopa, even if it is not the immediate release formulation.  She understood that.  -She asked about other medications and we discussed things like dopamine agonist and rasagiline.  She decided to hold off on those.  -She asked about clinical trials.  We pulled up clinical trials.gov and discussed the transcranial magnetic stim trial going on at [facility].  She was given the contact information for that.  2.  Chronic low back pain  -following with neurosx/[facility].  This affects her ability to exercise. Subjective:   Eileen Cook was seen today in follow up for Parkinsons disease.  My previous records were reviewed prior to todays visit as well as outside records available to me.  Levodopa was initiated last visit and she reports that she stopped it.  It made her sleepy so she d/c it and just took it q hs and then felt that she could not walk one AM when she woke up one AM so she d/c it.  She's been off of it x 2 months.  Pt denies falls.  Pt denies lightheadedness, near syncope.  No hallucinations.  Mood has been good.  Admits to chronic back pain.  Current prescribed movement disorder medications: Carbidopa/levodopa 25/100, 1 tablet 3 times daily (started last visit)   PREVIOUS MEDICATIONS: none to date  ALLERGIES:   Allergies  Allergen Reactions   Codeine     CURRENT MEDICATIONS:  Outpatient Encounter Medications as of 09/07/2021  Medication Sig   acyclovir (ZOVIRAX) 200 MG capsule TAKE 1 CAPSULE BY MOUTH TWICE DAILY   Boswellia-Glucosamine-Vit D (GLUCOSAMINE COMPLEX PO) Take 1 tablet by mouth daily.   Calcium Carbonate-Vit D-Min (CALCIUM 1200 PO) Take 1 tablet by mouth daily.    carbidopa-levodopa (SINEMET IR) 25-100 MG tablet Take 1 tablet by mouth 3 (three) times daily. 7am/11am/4pm    cholecalciferol (VITAMIN D) 1000 units tablet Take 2,000 Units by mouth daily.   Multiple Vitamin (MULTIVITAMIN) tablet Take 1 tablet by mouth daily.   simvastatin (ZOCOR) 20 MG tablet Take 1 tablet by mouth daily.   vitamin C (ASCORBIC ACID) 250 MG tablet Take 250 mg by mouth daily.   No facility-administered encounter medications on file as of 09/07/2021.    Objective:   PHYSICAL EXAMINATION:    VITALS:   Vitals:   09/07/21 1458  BP: 125/78  Pulse: 72  SpO2: 98%  Weight: 175 lb 9.6 oz (79.7 kg)  Height: 5\' 8"  (1.727 m)    Wt Readings from Last 3 Encounters:  09/07/21 175 lb 9.6 oz (79.7 kg)  03/23/21 175 lb (79.4 kg)  09/15/20 170 lb (77.1 kg)     GEN:  The patient appears stated age and is in NAD. HEENT:  Normocephalic, atraumatic.  The mucous membranes are moist.   Neurological examination:  Orientation: The patient is alert and oriented x3. Cranial nerves: There is good facial symmetry with minimal facial hypomimia. The speech is fluent and clear. Soft palate rises symmetrically and there is no tongue deviation. Hearing is intact to conversational tone. Sensation: Sensation is intact to light touch throughout Motor: Strength is at least antigravity x4.  Movement examination: Tone: There is nl tone in the UE Abnormal movements: There is just mild right upper extremity rest tremor today. Coordination:  There is mild decremation on the  R Gait and Station: The patient has no difficulty arising out of a deep-seated chair without the use of the hands. The patient's stride length is good today.  Total time spent on today's visit was 33 minutes, including both face-to-face time and nonface-to-face time.  Time included that spent on review of records (prior notes available to me/labs/imaging if pertinent), discussing treatment and goals, answering patient's questions and coordinating care.  Cc:  Crist Infante, MD

## 2021-09-07 ENCOUNTER — Other Ambulatory Visit: Payer: Self-pay

## 2021-09-07 ENCOUNTER — Ambulatory Visit: Payer: Medicare HMO | Admitting: Neurology

## 2021-09-07 ENCOUNTER — Encounter: Payer: Self-pay | Admitting: Neurology

## 2021-09-07 VITALS — BP 125/78 | HR 72 | Ht 68.0 in | Wt 175.6 lb

## 2021-09-07 DIAGNOSIS — G20 Parkinson's disease: Secondary | ICD-10-CM | POA: Diagnosis not present

## 2021-09-07 NOTE — Patient Instructions (Signed)
The study name is:   Use of a Non-Invasive Brainstem Neuromodulation Device to Improve Neurovascular Status in Parkinson's Disease Contact: Laurice Record, MD, PhD [phone] [email or web address]  Contact: Stevan Born, [place] [email or web address]   Locations [place], [place] [employer], [place], [place], [place] Contact: [place]       [email or web address]    Contact: Laurice Record, MD, PhD, MHA          Principal Investigator: Rivka Safer, MD, PhD, [facility]

## 2021-09-14 ENCOUNTER — Encounter (HOSPITAL_BASED_OUTPATIENT_CLINIC_OR_DEPARTMENT_OTHER): Payer: Self-pay | Admitting: Obstetrics & Gynecology

## 2021-09-14 ENCOUNTER — Ambulatory Visit (INDEPENDENT_AMBULATORY_CARE_PROVIDER_SITE_OTHER): Payer: Medicare HMO | Admitting: Obstetrics & Gynecology

## 2021-09-14 ENCOUNTER — Other Ambulatory Visit: Payer: Self-pay

## 2021-09-14 VITALS — BP 119/65 | HR 85 | Ht 68.0 in | Wt 177.4 lb

## 2021-09-14 DIAGNOSIS — B009 Herpesviral infection, unspecified: Secondary | ICD-10-CM | POA: Diagnosis not present

## 2021-09-14 DIAGNOSIS — Z9189 Other specified personal risk factors, not elsewhere classified: Secondary | ICD-10-CM | POA: Diagnosis not present

## 2021-09-14 DIAGNOSIS — Z124 Encounter for screening for malignant neoplasm of cervix: Secondary | ICD-10-CM

## 2021-09-14 DIAGNOSIS — G20 Parkinson's disease: Secondary | ICD-10-CM

## 2021-09-14 MED ORDER — ACYCLOVIR 200 MG PO CAPS: 200.0000 mg | ORAL_CAPSULE | Freq: Two times a day (BID) | ORAL | 3 refills | Status: AC

## 2021-09-14 NOTE — Progress Notes (Signed)
70 y.o. [id number] Divorced White or Caucasian female here for breast and pelvic exam.  Doing well except reports she did have a reaction to Sinemet for Parkinson's so stopped this.  Still working full time and just didn't think she could tolerate worse symptoms.  Followed by Dr. Carles Collet for Parkinson's.  Started sinemet but had side effects.  Is planning on seeing neurologist at [facility].  Has appt in 2023.  She is aware I have a close friend with Parkinson's so we have discussed this in the past.    Denies vaginal bleeding.  Daughter in law is expecting a baby boy in February.  This will be her first grandchild.  She is so happy about this.  Baby shower planned in [place] in two weeks.    Is on acyclovir and does need rf.  Does well with this.  No recent outbreaks.  Patient's last menstrual period was 11/25/2001.          Sexually active: No.  H/O STD:  no  Health Maintenance: PCP:  Dr. Joylene Draft.  Last wellness appt was earlier this year.  Did blood work at that appt:  yes Vaccines are up to date:  reviewed Colonoscopy:  06/09/2020 MMG:  04/07/2020 Negative BMD:  01/06/2019 Last pap smear:  01/30/2018 Negative.   H/o abnormal pap smear:  no    reports that she has never smoked. She has never used smokeless tobacco. She reports current alcohol use of about 3.0 standard drinks per week. She reports that she does not use drugs.  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 Carles Collet   Spinal stenosis    sees Dr Vertell Limber    Past Surgical History:  Procedure Laterality Date   BARTHOLIN CYST MARSUPIALIZATION  1/87   BREAST CYST EXCISION Right 03/2017   CATARACT EXTRACTION, BILATERAL  2019   CESAREAN SECTION     ROOT CANAL  2019    Current Outpatient Medications  Medication Sig Dispense Refill   Boswellia-Glucosamine-Vit D (GLUCOSAMINE COMPLEX PO) Take 1 tablet by mouth daily.      Calcium Carbonate-Vit D-Min (CALCIUM 1200 PO) Take 1 tablet by mouth daily.      cholecalciferol (VITAMIN D) 1000 units tablet Take 2,000 Units by mouth daily.     Multiple Vitamin (MULTIVITAMIN) tablet Take 1 tablet by mouth daily.     simvastatin (ZOCOR) 20 MG tablet Take 1 tablet by mouth daily.     vitamin C (ASCORBIC ACID) 250 MG tablet Take 250 mg by mouth daily.     acyclovir (ZOVIRAX) 200 MG capsule Take 1 capsule (200 mg total) by mouth 2 (two) times daily. 180 capsule 3   carbidopa-levodopa (SINEMET IR) 25-100 MG tablet Take 1 tablet by mouth 3 (three) times daily. 7am/11am/4pm (Patient not taking: Reported on 09/14/2021) 270 tablet 1   No current facility-administered medications for this visit.    Family History  Problem Relation Age of Onset   Colon cancer Mother 71   Colon cancer Maternal Grandmother    Esophageal cancer Father    Hypertension Sister        #1   Lung cancer Sister    Mitral valve prolapse Sister        #2    Review of Systems  Constitutional: Negative.   Genitourinary: Negative.    Exam:  BP 119/65 (BP Location: Left Arm, Patient Position: Sitting, Cuff Size: Large)   Pulse 85   Ht 5\' 8"  (1.727 m)   Wt 177 lb 6.4 oz (80.5 kg)   LMP 11/25/2001   BMI 26.97 kg/m   Height: 5\' 8"  (172.7 cm)  General appearance: alert, cooperative and appears stated age Breasts: normal appearance, no masses or tenderness Abdomen: soft, non-tender; bowel sounds normal; no masses,  no organomegaly Lymph nodes: Cervical, supraclavicular, and axillary nodes normal.  No abnormal inguinal nodes palpated Neurologic: Grossly normal  Pelvic: External genitalia:  no lesions              Urethra:  normal appearing urethra with no masses, tenderness or lesions              Bartholins and Skenes: normal                 Vagina: normal appearing vagina with atrophic changes and no discharge, no lesions              Cervix: no lesions              Pap taken: No. Bimanual  Exam:  Uterus:  normal size, contour, position, consistency, mobility, non-tender              Adnexa: normal adnexa and no mass, fullness, tenderness               Rectovaginal: Confirms               Anus:  normal sphincter tone, no lesions  Chaperone, Octaviano Batty, CMA, was present for exam.  Assessment/Plan: 1. GYN exam for high-risk Medicare patient - will plan pap smear next year.  Shared decision making about stopping paps was discussed today. - MMG 03/2020.  Pt aware should do this next year. - Colonoscopy 05/2020.  Pt thinks she will not do another one.  Prep was hard to complete.  Cologuard discussed. - last BMD 2020 - lab work done with Dr. Joylene Draft - vaccines updated/reviewed  2. Parkinson's disease ([place])  3.  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  Takes daily.

## 2021-09-15 ENCOUNTER — Encounter (HOSPITAL_BASED_OUTPATIENT_CLINIC_OR_DEPARTMENT_OTHER): Payer: Self-pay | Admitting: Obstetrics & Gynecology

## 2021-09-15 DIAGNOSIS — Z23 Encounter for immunization: Secondary | ICD-10-CM | POA: Diagnosis not present

## 2021-10-29 DIAGNOSIS — M8580 Other specified disorders of bone density and structure, unspecified site: Secondary | ICD-10-CM | POA: Diagnosis not present

## 2021-10-29 DIAGNOSIS — S29011A Strain of muscle and tendon of front wall of thorax, initial encounter: Secondary | ICD-10-CM | POA: Diagnosis not present

## 2021-10-29 DIAGNOSIS — M4850XS Collapsed vertebra, not elsewhere classified, site unspecified, sequela of fracture: Secondary | ICD-10-CM | POA: Diagnosis not present

## 2021-10-29 DIAGNOSIS — M546 Pain in thoracic spine: Secondary | ICD-10-CM | POA: Diagnosis not present

## 2021-10-29 DIAGNOSIS — M545 Low back pain, unspecified: Secondary | ICD-10-CM | POA: Diagnosis not present

## 2021-10-29 DIAGNOSIS — G8929 Other chronic pain: Secondary | ICD-10-CM | POA: Diagnosis not present

## 2021-11-21 DIAGNOSIS — R030 Elevated blood-pressure reading, without diagnosis of hypertension: Secondary | ICD-10-CM | POA: Diagnosis not present

## 2021-11-21 DIAGNOSIS — G8929 Other chronic pain: Secondary | ICD-10-CM | POA: Diagnosis not present

## 2021-11-21 DIAGNOSIS — M25512 Pain in left shoulder: Secondary | ICD-10-CM | POA: Diagnosis not present

## 2021-11-21 DIAGNOSIS — Z6827 Body mass index (BMI) 27.0-27.9, adult: Secondary | ICD-10-CM | POA: Diagnosis not present

## 2021-11-28 DIAGNOSIS — M25512 Pain in left shoulder: Secondary | ICD-10-CM | POA: Diagnosis not present

## 2021-12-03 DIAGNOSIS — M25512 Pain in left shoulder: Secondary | ICD-10-CM | POA: Diagnosis not present

## 2021-12-12 DIAGNOSIS — M25512 Pain in left shoulder: Secondary | ICD-10-CM | POA: Diagnosis not present

## 2022-01-02 DIAGNOSIS — M25512 Pain in left shoulder: Secondary | ICD-10-CM | POA: Diagnosis not present

## 2022-01-08 DIAGNOSIS — R21 Rash and other nonspecific skin eruption: Secondary | ICD-10-CM | POA: Diagnosis not present

## 2022-01-29 DIAGNOSIS — G20 Parkinson's disease: Secondary | ICD-10-CM | POA: Diagnosis not present

## 2022-02-11 DIAGNOSIS — H04123 Dry eye syndrome of bilateral lacrimal glands: Secondary | ICD-10-CM | POA: Diagnosis not present

## 2022-02-11 DIAGNOSIS — H02831 Dermatochalasis of right upper eyelid: Secondary | ICD-10-CM | POA: Diagnosis not present

## 2022-02-11 DIAGNOSIS — Z961 Presence of intraocular lens: Secondary | ICD-10-CM | POA: Diagnosis not present

## 2022-02-11 DIAGNOSIS — Z0100 Encounter for examination of eyes and vision without abnormal findings: Secondary | ICD-10-CM | POA: Diagnosis not present

## 2022-02-11 DIAGNOSIS — H5213 Myopia, bilateral: Secondary | ICD-10-CM | POA: Diagnosis not present

## 2022-02-27 DIAGNOSIS — M25512 Pain in left shoulder: Secondary | ICD-10-CM | POA: Diagnosis not present

## 2022-03-04 NOTE — Progress Notes (Signed)
? ? ?Assessment/Plan:  ? ?1.  Parkinsons Disease, with diagnosis in January, 2020 ? -she will increase her carbidopa/levodopa 25/100 to bid, 7am/11am.  Hopefully, this will be well-tolerated and she will then be able to increase to tid dosing ? -She asked about things in the pipeline and we discussed those. ? -Appreciate Dr. Donna Christen input.  Did tell patient that she did not chronically need two separate movement disorder physicians and she just needed to choose 1 and stay with that 1 physician.  She liked very much that [facility] does more research, and I told her that I appreciate that, and it may be valuable therefore for her to stay with [facility].  She really was hoping to go to both physicians, but I told her that our access issue was such that we ask patients to choose one location and stay there. ? -We discussed that it used to be thought that levodopa would increase risk of melanoma but now it is believed that Parkinsons itself likely increases risk of melanoma. she is to get regular skin checks and she does that.  She sees Dr. Delman Cheadle ? ?2.  Chronic low back pain ? -following with neurosx/Dr. Dawley.  This affects her ability to exercise. ?Subjective:  ? ?Eileen Cook was seen today in follow up for Parkinsons disease.  My previous records were reviewed prior to todays visit as well as outside records available to me.  Patient currently on no Parkinson's medications, which is her choice.  She tried immediately release levodopa in the past and it made her sleepy and she did not want to trial other medications.  Since our last visit, the patient did have a consultation with Dr. Deboraha Sprang for second opinion.  I reviewed those notes.  This consultation was just on March 7.  He recommended retrying levodopa, this time only going to 1 pill twice a day (which is about what she got to last time), but also using the CR version.  She is back on the IR (didn't try the CR) and takes q day.  She is tolerating it thus far.   She does have chronic low back pain.  She is now seeing Dr. Reatha Armour. ? ?Current prescribed movement disorder medications: ? ?carbidopa/levodopa 25/100 CR, she is only taking 1 pill per day ? ? ? ?PREVIOUS MEDICATIONS: Carbidopa/levodopa 25/100 IR (EDS) ? ?ALLERGIES:   ?Allergies  ?Allergen Reactions  ? Codeine   ? Crestor [Rosuvastatin] Other (See Comments)  ? ? ?CURRENT MEDICATIONS:  ?Outpatient Encounter Medications as of 03/13/2022  ?Medication Sig  ? acyclovir (ZOVIRAX) 200 MG capsule Take 1 capsule (200 mg total) by mouth 2 (two) times daily.  ? Boswellia-Glucosamine-Vit D (GLUCOSAMINE COMPLEX PO) Take 1 tablet by mouth daily.  ? Calcium Carbonate-Vit D-Min (CALCIUM 1200 PO) Take 1 tablet by mouth daily.   ? carbidopa-levodopa (SINEMET IR) 25-100 MG tablet Take 1 tablet by mouth 3 (three) times daily. 7am/11am/4pm  ? cholecalciferol (VITAMIN D) 1000 units tablet Take 2,000 Units by mouth daily.  ? Multiple Vitamin (MULTIVITAMIN) tablet Take 1 tablet by mouth daily.  ? simvastatin (ZOCOR) 20 MG tablet Take 1 tablet by mouth daily.  ? vitamin C (ASCORBIC ACID) 250 MG tablet Take 250 mg by mouth daily.  ? ?No facility-administered encounter medications on file as of 03/13/2022.  ? ? ?Objective:  ? ?PHYSICAL EXAMINATION:   ? ?VITALS:   ?Vitals:  ? 03/13/22 1116  ?BP: 123/82  ?Pulse: 73  ?SpO2: 98%  ?Weight: 172 lb 3.2  oz (78.1 kg)  ?Height: '5\' 8"'$  (1.727 m)  ? ? ? ?Wt Readings from Last 3 Encounters:  ?03/13/22 172 lb 3.2 oz (78.1 kg)  ?09/14/21 177 lb 6.4 oz (80.5 kg)  ?09/07/21 175 lb 9.6 oz (79.7 kg)  ? ? ? ?GEN:  The patient appears stated age and is in NAD. ?HEENT:  Normocephalic, atraumatic.  The mucous membranes are moist.  ?Cardiovascular: Regular rate rhythm ?Lungs: Clear to auscultation bilaterally ?Neck: No bruits ? ?Neurological examination: ? ?Orientation: The patient is alert and oriented x3. ?Cranial nerves: There is good facial symmetry with minimal facial hypomimia. The speech is fluent and clear.  Soft palate rises symmetrically and there is no tongue deviation. Hearing is intact to conversational tone. ?Sensation: Sensation is intact to light touch throughout ?Motor: Strength is at least antigravity x4. ? ?Movement examination: ?Tone: There is nl tone in the UE ?Abnormal movements: There is just mild right upper extremity rest tremor today and rare LUE rest tremor. ?Coordination:  There is no decremation, with any form of RAMS, including alternating supination and pronation of the forearm, hand opening and closing, finger taps, heel taps and toe taps. ?Gait and Station: The patient has no difficulty arising out of a deep-seated chair without the use of the hands. The patient's stride length is good today. ? ? ?Cc:  Crist Infante, MD ?

## 2022-03-13 ENCOUNTER — Ambulatory Visit: Payer: Medicare HMO | Admitting: Neurology

## 2022-03-13 VITALS — BP 123/82 | HR 73 | Ht 68.0 in | Wt 172.2 lb

## 2022-03-13 DIAGNOSIS — G20 Parkinson's disease: Secondary | ICD-10-CM

## 2022-03-13 NOTE — Patient Instructions (Signed)
Local and Online Resources for Power over Parkinson's Group ?April 2023 ? ?LOCAL [place] PARKINSON'S GROUPS  ?Power over Parkinson's Group:   ?Power Over Parkinson's Patient Education Group will be Wednesday, April 12th-*Hybrid meting*- in person at [facility] location and via [facility] at 2:00 pm.   ?Upcoming Power over Parkinson's Meetings:  2nd Wednesdays of the month at 2 pm:  April 12th, May 10th ?Contact Amy Marriott at [email or web address] if interested in participating in this group ?Parkinson's Care Partners Group:    3rd Mondays, Contact Misty Paladino ?Atypical Parkinsonian Patient Group:   4th Wednesdays, Contact Misty Paladino ?If you are interested in participating in these groups with Misty, please contact her directly for how to join those meetings.  Her contact information is [email or web address].   ? ?LOCAL EVENTS AND NEW OFFERINGS ?Dine out at [employer].  Celebrate Parkinson's disease Awareness Month and Support the Parkinson's Movement Disorder Fund.   Wednesday, April 19th 4-6 pm at [place], [employer].  (Give receipt to cashier and 20% will be donated) ?[place]!  Play [place]!  Join [place] for home game for a fun evening to bring awareness of Parkinson's and raise funds for our Movement Disorder Funds. April 28th 6:30 pm [address] To purchase tickets:  [email or web address] ?Parkinson's T-shirts for sale!  Designed by a local group member, with funds going to [place].  $20.00  Contact Misty to purchase (see email above) ?New PWR! Moves [employer] Instructor-Led Class offering at [employer]! Starting Wednesdays 1-2 pm, starting April 12th.   Contact Bryson Dames, Acupuncturist at [employer].  [email or web address] [email or web address] ? ?ONLINE EDUCATION AND SUPPORT ?[place]:  www.parkinson.org ?PD Health at Home continues:  Mindfulness Mondays, Expert Briefing Tuesdays,  Wellness Wednesdays, Take Time Thursdays, Fitness Fridays  ?Upcoming Education:  ?Freezing and Fall Prevention in Parkinson's.  Wednesday, April 12th at 1:00 pm ?Understanding Gene and Cell-Based Therapies in Parkinson's.  Wednesday, May 10th at 1:00 pm ?Register for expert briefings (webinars) at [email or web address] ?Please check out their website to sign up for emails and see their full online offerings ? ? ?[place]:  [email or web address]  ?Third Thursday Webinars:  On the third Thursday of every month at 12 p.m. ET, join our free live webinars to learn about various aspects of living with Parkinson's disease and our work to speed medical breakthroughs. ?Upcoming Webinar:  Brainwaves:  The Potential of Focused Ultrasound and Deep Brain Stimulation in PD.  Thursday, April 20th at  12 noon. ?Check out additional information on their website to see their full online offerings ? ?[place]:  [email or web address] ?Upcoming Webinar:   Stay tuned ?Webinar Series:  Living with Parkinson's Meetup.   Third Thursdays each month, 3 pm ?Care Partner Monthly Meetup.  With Robin Searing Phinney.  First Tuesday of each month, 2 pm ?Check out additional information to Live Well Today on their website ? ?Parkinson and Movement Disorders (PMD) Alliance:  www.pmdalliance.org ?[employer] Online:  Online Education Events ?Sign up for emails, which are sent weekly to give you updates on programming and online offerings ? ?Parkinson's Association of [place]:  www.parkinsonassociation.org ?Information on online support groups, education events, and online exercises including Yoga, Parkinson's exercises and more-LOTS of information on links to PD resources and online events ?Virtual Support Group through [employer] of the [place]; next one is scheduled for Wednesday, April 5th at 2 pm. (These are typically  scheduled for the 1st Wednesday of the month at 2 pm).  Visit website for  details. ?MOVEMENT AND EXERCISE OPPORTUNITIES ?Parkinson's DRUMMING Classes/Music Therapy with Doylene Canning:  This is a returning class and it's FREE!  2nd Mondays, continuing April 10th  , 11:00 at the [place].  Contact *Misty Taylor-Paladino at [email or web address] or Doylene Canning at [phone] or [email or web address]  ?PWR! Moves Classes at [place].  Wednesdays 10 and 11 am.   Contact Amy Marriott, PT [email or web address] if interested. ?NEW PWR! Moves Class offering at [employer].  Wednesdays 1-2 pm, starting April 12th.  Contact Bryson Dames, Acupuncturist at [employer].  [email or web address] [email or web address] ?Here is a link to the PWR!Moves classes on Zoom from [place] - Daily Mon-Sat at 10:00. Via Zoom, FREE and open to all.  There is also a link below via Facebook if you use that platform. ? ?[email or web address].si ?[email or web address]://[email or web address].no ? ?Parkinson's Wellness Recovery (PWR! Moves)  www.[email or web address] ?Info on the PWR! Virtual Experience:  You will have access to our expertise through self-assessment, guided plans that start with the PD-specific fundamentals, educational content, tips, Q&A with an expert, and a growing Art therapist of PD-specific pre-recorded and live exercise classes of varying types and intensity - both physical and cognitive! If that is not enough, we offer 1:1 wellness consultations (in-person or virtual) to personalize your PWR! Research scientist (medical).  ?[employer] Fridays:  ?As part of the PD Health @ Home program, this free video series focuses each week on one aspect of fitness designed to support people living with  Parkinson's.  These weekly videos highlight the [place] recent fitness guidelines for people with Parkinson's disease. ?www.KVTVnet.com.cy ?Dance for PD website is offering free, live-stream classes throughout the week, as well as links to [employer] of classes:  https://danceforparkinsons.org/ ?Dance for Parkinson's in-person class.  February 1-April 26, Wednesdays 4-5 pm.  Free class for people with Parkinson's disease, at [address], [place], [place].  Contact [phone] or [email or web address] to register ?Virtual dance and Pilates for Parkinson's classes: Click on the Community Tab> Parkinson's Movement Initiative Tab.  To register for classes and for more information, visit www.SeekAlumni.co.za and click the ?community? tab.  ?[employer] Parkinson's Cycling Classes  ?[facility]:  Thursdays @ Noon-Live classes at [employer] ([employer] at [email or web address] or [phone]) ?[facility] [employer] [employer]: Virtual Classes Mondays and Thursdays Jeanette Caprice classes Tuesday, Wednesday and Thursday (contact Mason at [email or web address]  or [phone]) ?[employer] ?Varied levels of classes are offered Mondays, Tuesdays and Thursdays at [employer].  ?Stretching with Verdis Frederickson weekly class is also offered for people with Parkinson's ?To observe a class or for more information, call [phone] or email Hezzie Bump at [email or web address] .[email or web address] ?ADDITIONAL SUPPORT AND RESOURCES ?[employer]:Online Caregiver Education Opportunities:  www.well-springsolutions.org/caregiver-education/caregiver-support-group.  You may also contact Vickki Muff at [email or web address] or [phone].    ?Well-Spring Navigator:  Just1Navigator program, a free service to help individuals and families through the journey of determining care for older adults.  The ?Navigator? is a [phone],  Education officer, museum, who will speak with a prospective client and/or loved ones to provide an assessment of the situation and a set of recommendations for a personalized care plan -- all free of charge, and whether [facility]

## 2022-04-10 DIAGNOSIS — M25512 Pain in left shoulder: Secondary | ICD-10-CM | POA: Diagnosis not present

## 2022-04-23 ENCOUNTER — Other Ambulatory Visit: Payer: Self-pay

## 2022-04-23 NOTE — Telephone Encounter (Signed)
FYI. Rx refill request refused and note sent to fax to Dr. Ammie Ferrier new location @ [place] location.

## 2022-04-24 DIAGNOSIS — I700 Atherosclerosis of aorta: Secondary | ICD-10-CM | POA: Diagnosis not present

## 2022-04-24 DIAGNOSIS — E785 Hyperlipidemia, unspecified: Secondary | ICD-10-CM | POA: Diagnosis not present

## 2022-04-24 DIAGNOSIS — Z0000 Encounter for general adult medical examination without abnormal findings: Secondary | ICD-10-CM | POA: Diagnosis not present

## 2022-04-24 DIAGNOSIS — E559 Vitamin D deficiency, unspecified: Secondary | ICD-10-CM | POA: Diagnosis not present

## 2022-04-26 DIAGNOSIS — H02132 Senile ectropion of right lower eyelid: Secondary | ICD-10-CM | POA: Diagnosis not present

## 2022-04-26 DIAGNOSIS — H0289 Other specified disorders of eyelid: Secondary | ICD-10-CM | POA: Diagnosis not present

## 2022-04-26 DIAGNOSIS — M25511 Pain in right shoulder: Secondary | ICD-10-CM | POA: Diagnosis not present

## 2022-04-26 DIAGNOSIS — H04203 Unspecified epiphora, bilateral lacrimal glands: Secondary | ICD-10-CM | POA: Diagnosis not present

## 2022-04-26 DIAGNOSIS — H02135 Senile ectropion of left lower eyelid: Secondary | ICD-10-CM | POA: Diagnosis not present

## 2022-05-01 ENCOUNTER — Other Ambulatory Visit: Payer: Self-pay | Admitting: Internal Medicine

## 2022-05-01 DIAGNOSIS — G20 Parkinson's disease: Secondary | ICD-10-CM | POA: Diagnosis not present

## 2022-05-01 DIAGNOSIS — I700 Atherosclerosis of aorta: Secondary | ICD-10-CM | POA: Diagnosis not present

## 2022-05-01 DIAGNOSIS — A6000 Herpesviral infection of urogenital system, unspecified: Secondary | ICD-10-CM | POA: Diagnosis not present

## 2022-05-01 DIAGNOSIS — E785 Hyperlipidemia, unspecified: Secondary | ICD-10-CM | POA: Diagnosis not present

## 2022-05-01 DIAGNOSIS — M542 Cervicalgia: Secondary | ICD-10-CM | POA: Diagnosis not present

## 2022-05-01 DIAGNOSIS — R011 Cardiac murmur, unspecified: Secondary | ICD-10-CM | POA: Diagnosis not present

## 2022-05-01 DIAGNOSIS — R69 Illness, unspecified: Secondary | ICD-10-CM | POA: Diagnosis not present

## 2022-05-01 DIAGNOSIS — R251 Tremor, unspecified: Secondary | ICD-10-CM | POA: Diagnosis not present

## 2022-05-01 DIAGNOSIS — M8580 Other specified disorders of bone density and structure, unspecified site: Secondary | ICD-10-CM | POA: Diagnosis not present

## 2022-05-01 DIAGNOSIS — R82998 Other abnormal findings in urine: Secondary | ICD-10-CM | POA: Diagnosis not present

## 2022-05-01 DIAGNOSIS — Z0000 Encounter for general adult medical examination without abnormal findings: Secondary | ICD-10-CM | POA: Diagnosis not present

## 2022-05-17 IMAGING — MR MR LUMBAR SPINE W/O CM
5 series · 43 of 48 positions shown · non-contrast
Comparison: Prior outside study from 8450 is of poor quality

CLINICAL DATA: Chronic low back pain and leg pain

EXAM:
MRI LUMBAR SPINE WITHOUT CONTRAST
TECHNIQUE: Multiplanar, multisequence MR imaging of the lumbar spine was
performed. No intravenous contrast was administered.

[Series 3: tirm sag · sagittal · 4.0mm · 0.55mm/px · 6 of 13 slices shown]
[im 1/13]
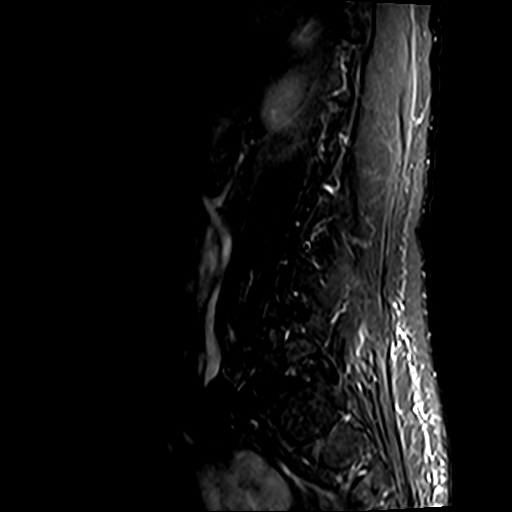
[im 3/13]
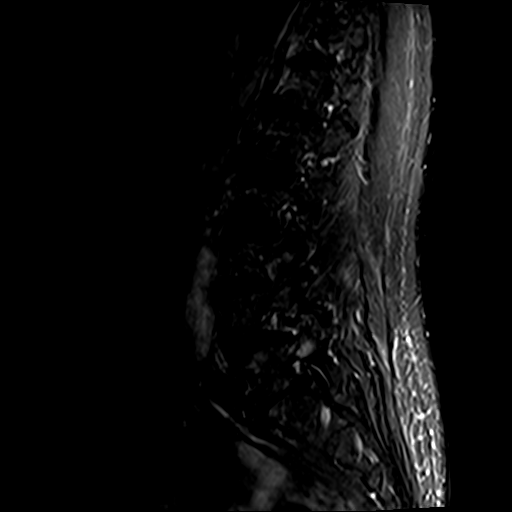
[im 5/13]
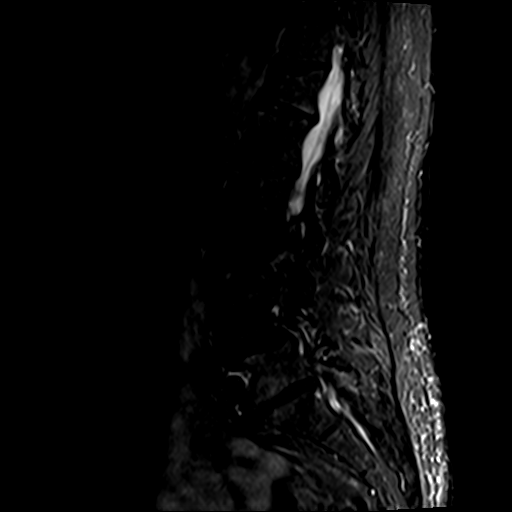
[im 8/13]
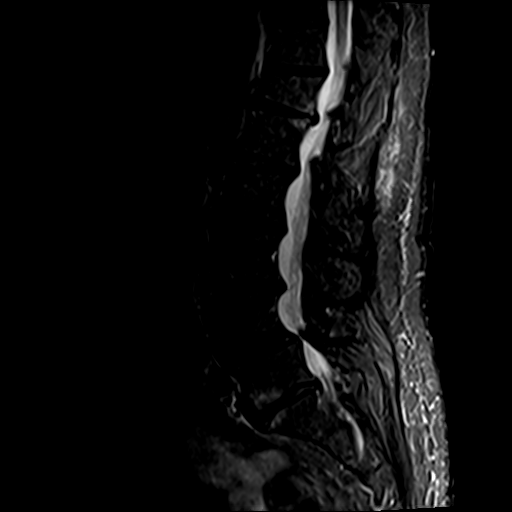
[im 10/13]
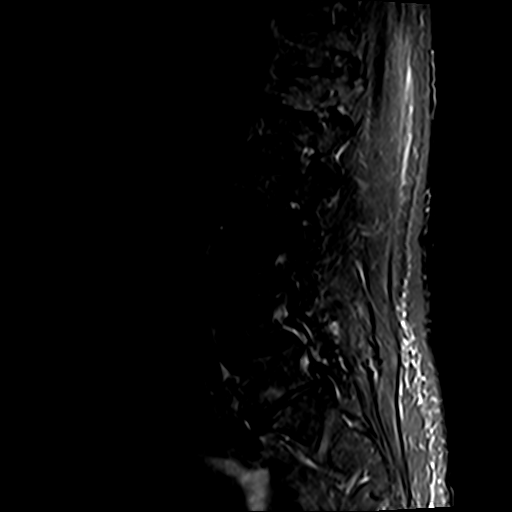
[im 13/13]
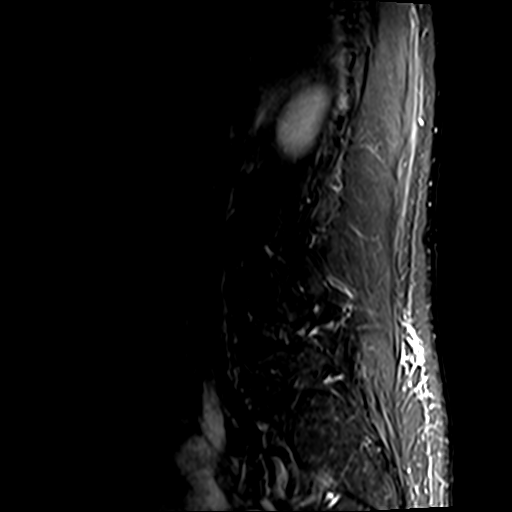

[Series 4: T2 · sagittal · 4.0mm · 0.88mm/px · 5 of 13 slices shown (1 of 2)]
[im 1/13]
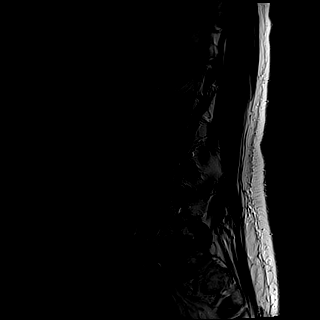
[im 4/13]
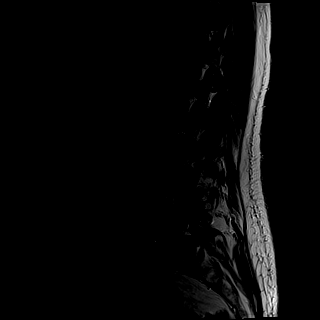
[im 7/13]
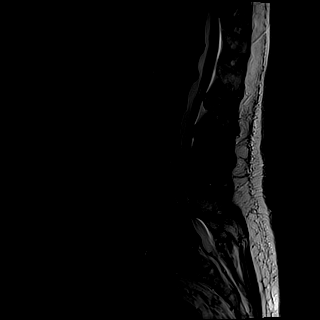
[im 10/13]
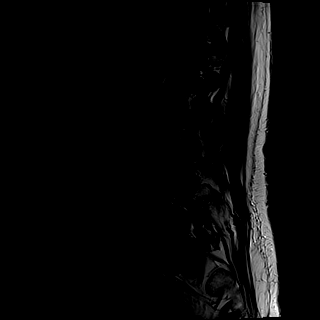
[im 13/13]
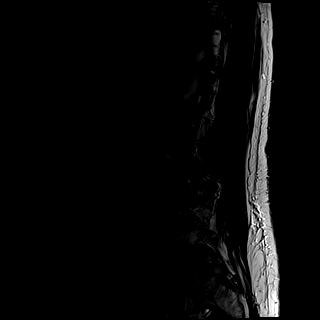

[Series 5: T1 · sagittal · 4.0mm · 0.88mm/px · 5 of 13 slices shown (1 of 2)]
[im 1/13]
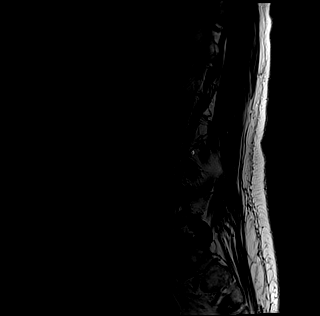
[im 4/13]
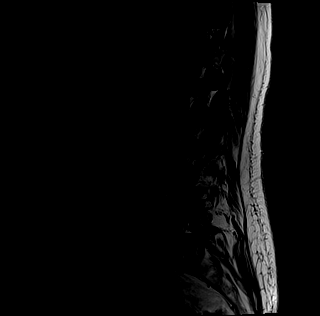
[im 7/13]
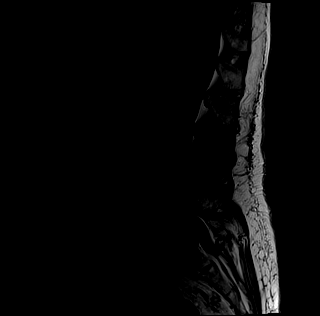
[im 10/13]
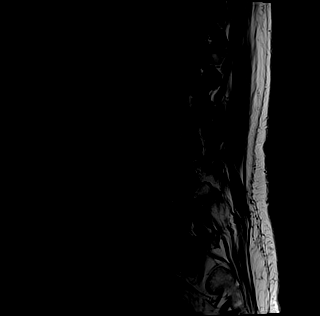
[im 13/13]
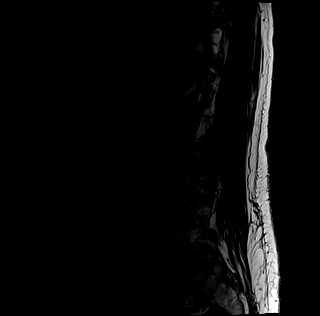

[Series 6: T1 · axial · 4.0mm · 0.78mm/px · z∈[-34,+185]mm · 11 of 40 slices shown (2 of 2)]
[im 1/40]
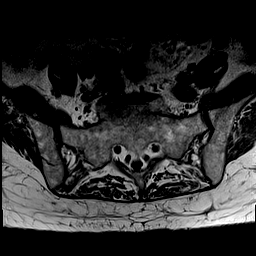
[im 3/40]
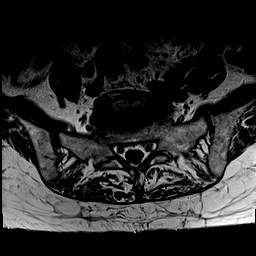
[im 6/40]
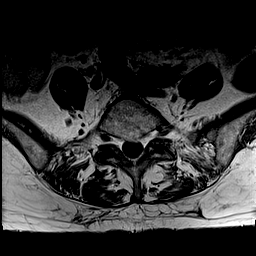
[im 8/40]
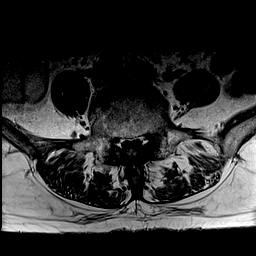
[im 14/40]
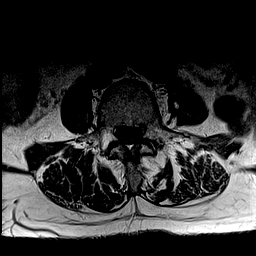
[im 19/40]
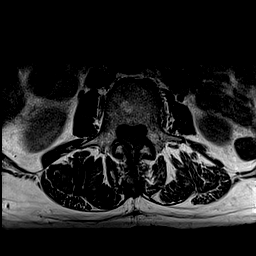
[im 21/40]
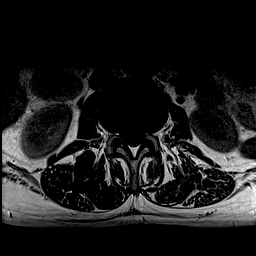
[im 24/40]
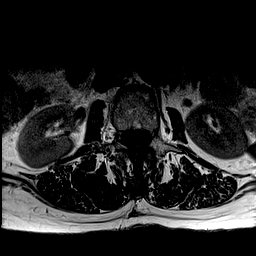
[im 29/40]
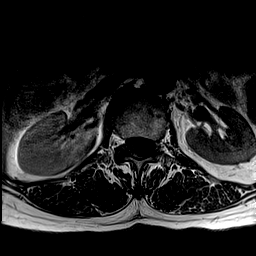
[im 34/40]
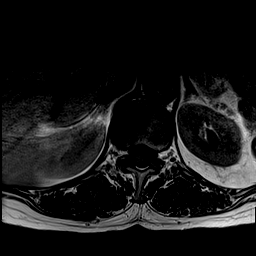
[im 40/40]
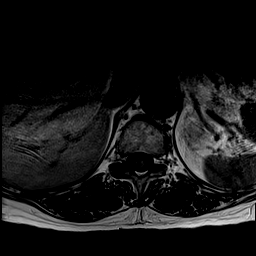

[Series 7: T2 · axial · 4.0mm · 0.78mm/px · z∈[-34,+185]mm · 16 of 40 slices shown (2 of 2)]
[im 1/40]
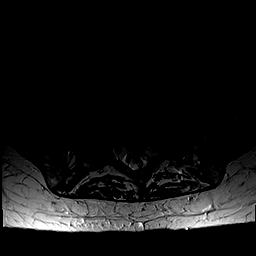
[im 3/40]
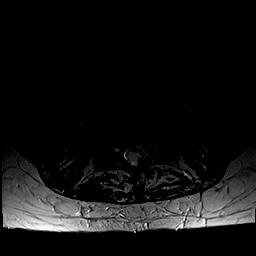
[im 6/40]
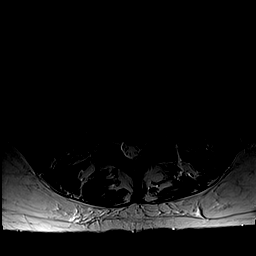
[im 8/40]
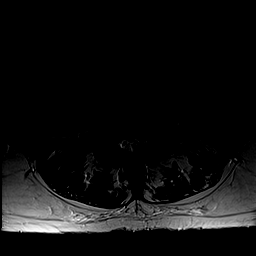
[im 11/40]
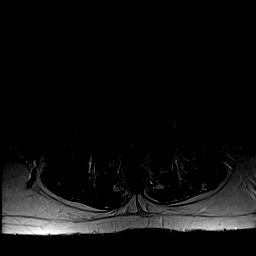
[im 14/40]
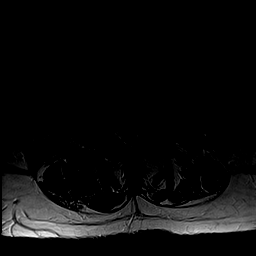
[im 16/40]
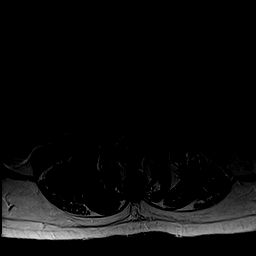
[im 19/40]
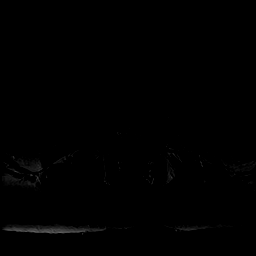
[im 21/40]
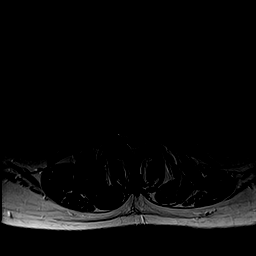
[im 24/40]
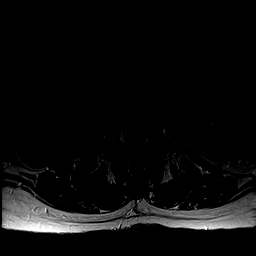
[im 27/40]
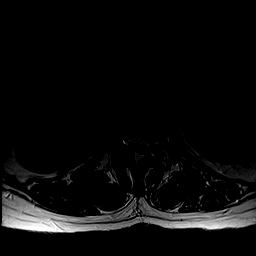
[im 29/40]
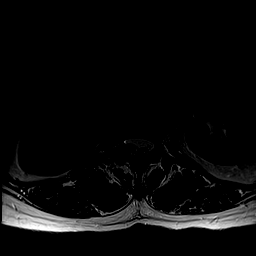
[im 32/40]
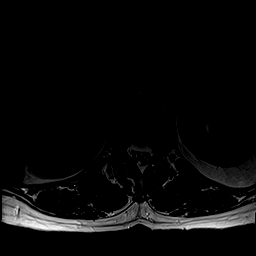
[im 34/40]
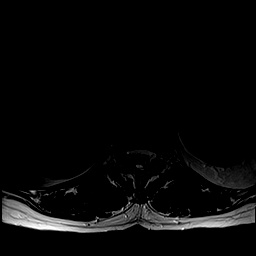
[im 37/40]
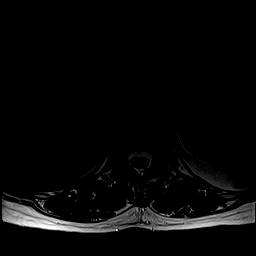
[im 40/40]
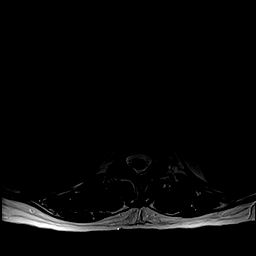

[43 of 48 positions shown; findings below may reference images not displayed]

FINDINGS: Segmentation:  Standard.

Alignment:  Slightly increased grade 1 anterolisthesis at L4-L5.

Vertebrae: Chronic loss of height at the superior endplate of T12.
Mild degenerative endplate irregularity. There is mild degenerate
endplate marrow edema at T12-L1, L3-L4, and L5-S1. Mild marrow edema
within the left L5 pedicle is probably on a degenerative basis. No
suspicious osseous lesion.

Conus medullaris and cauda equina: Conus extends to the L1 level.
Conus and cauda equina appear normal.

Paraspinal and other soft tissues: Unremarkable.

Disc levels:

T11-T12: Minimal disc bulge with small endplate osteophytes. Facet
arthropathy. No significant canal or foraminal stenosis.

T12-L1: Disc bulge and facet hypertrophy. No canal or left foraminal
stenosis. Increased mild right foraminal stenosis.

L1-L2: Minimal disc bulge. Facet arthropathy. No significant canal
or foraminal stenosis.

L2-L3: Disc bulge. Facet arthropathy with ligamentum flavum
infolding. No significant canal or foraminal stenosis.

L3-L4: Disc bulge with endplate osteophytic ridging eccentric to the
left. Facet arthropathy with ligamentum flavum infolding. Minor
canal stenosis. Narrowing of the left lateral recess. No significant
right foraminal stenosis. Moderate left foraminal stenosis.

L4-L5: Anterolisthesis with uncovering of disc bulge. Marked facet
arthropathy with ligamentum flavum infolding. Severe canal stenosis
with effacement of the lateral recesses. No significant right
foraminal stenosis. Minor left foraminal stenosis.

L5-S1: Disc bulge with endplate osteophytic ridging. Facet
arthropathy. No significant canal stenosis. Partial effacement of
lateral recesses. Mild foraminal stenosis.
IMPRESSION: Multilevel degenerative changes as detailed above with mild
progression since 8450. Canal and lateral recess stenosis are again
greatest at L4-L5. There is also lateral recess narrowing at L3-L4
and L5-S1.

## 2022-05-20 ENCOUNTER — Other Ambulatory Visit: Payer: Self-pay | Admitting: Neurology

## 2022-05-21 DIAGNOSIS — L570 Actinic keratosis: Secondary | ICD-10-CM | POA: Diagnosis not present

## 2022-05-21 DIAGNOSIS — L723 Sebaceous cyst: Secondary | ICD-10-CM | POA: Diagnosis not present

## 2022-05-21 DIAGNOSIS — L578 Other skin changes due to chronic exposure to nonionizing radiation: Secondary | ICD-10-CM | POA: Diagnosis not present

## 2022-05-21 DIAGNOSIS — D485 Neoplasm of uncertain behavior of skin: Secondary | ICD-10-CM | POA: Diagnosis not present

## 2022-05-21 DIAGNOSIS — L820 Inflamed seborrheic keratosis: Secondary | ICD-10-CM | POA: Diagnosis not present

## 2022-05-21 DIAGNOSIS — L821 Other seborrheic keratosis: Secondary | ICD-10-CM | POA: Diagnosis not present

## 2022-05-23 ENCOUNTER — Telehealth: Payer: Self-pay | Admitting: Neurology

## 2022-05-23 NOTE — Telephone Encounter (Signed)
Please double check this for me . In your last notes you said patient was supposed to be taking 2 pills a day but only taking 1. Prescription says three ata day. I called patient and we checked her bottle and it says 25/100I R TID is this correct for this patient

## 2022-05-23 NOTE — Telephone Encounter (Signed)
Pt called in stating she tried to pick up her carbidopa-levodopa at the pharmacy and she was told we denied it. She would like a prescription called in or give her a call so she knows why it was denied.

## 2022-05-24 ENCOUNTER — Other Ambulatory Visit: Payer: Self-pay

## 2022-05-24 MED ORDER — CARBIDOPA-LEVODOPA 25-100 MG PO TABS: 1.0000 | ORAL_TABLET | Freq: Three times a day (TID) | ORAL | 1 refills | Status: DC

## 2022-05-24 NOTE — Telephone Encounter (Signed)
  Called patient she said she wants to stay here as a patient and had cancelled that appointment with [facility]. I told her it was still showing and would hate for her to get a no show charge. Prescription sent in with the understanding that she is our patient and will not go back to [facility]

## 2022-06-05 DIAGNOSIS — Z6827 Body mass index (BMI) 27.0-27.9, adult: Secondary | ICD-10-CM | POA: Diagnosis not present

## 2022-06-05 DIAGNOSIS — M47812 Spondylosis without myelopathy or radiculopathy, cervical region: Secondary | ICD-10-CM | POA: Diagnosis not present

## 2022-07-08 DIAGNOSIS — Z0184 Encounter for antibody response examination: Secondary | ICD-10-CM | POA: Diagnosis not present

## 2022-08-26 ENCOUNTER — Other Ambulatory Visit: Payer: Self-pay

## 2022-08-26 ENCOUNTER — Telehealth: Payer: Self-pay | Admitting: Neurology

## 2022-08-26 MED ORDER — CARBIDOPA-LEVODOPA 25-100 MG PO TABS: 1.0000 | ORAL_TABLET | Freq: Three times a day (TID) | ORAL | 0 refills | Status: DC

## 2022-08-26 NOTE — Telephone Encounter (Signed)
Order sent to [facility] , [place], [place], [address]-- 2 weeks of meds.

## 2022-08-26 NOTE — Telephone Encounter (Signed)
Pt is out of town. She forgot her carbi/levo 25-100 at home. Requesting enough for two weeks.  Pharmacy- [facility] in [address] [phone]

## 2022-09-12 ENCOUNTER — Telehealth: Payer: Self-pay

## 2022-09-12 NOTE — Progress Notes (Signed)
Assessment/Plan:   1.  Parkinsons Disease, with diagnosis in January, 2020  -she will take carbidopa/levodopa 25/100 CR but move timing from 7am/noon/5pm  -discussed exercise  -We discussed that it used to be thought that levodopa would increase risk of melanoma but now it is believed that Parkinsons itself likely increases risk of melanoma. she is to get regular skin checks and she does that.  She sees Dr. Delman Cheadle  2.  Chronic low back pain  -following with neurosx/Dr. Dawley.  This affects her ability to exercise.  3.  Constipation  -discussed nature and pathophysiology and association with PD  -discussed importance of hydration.  Pt is to increase water intake  -pt is given a copy of the rancho recipe  -recommended daily colace  -recommended miralax prn  4.  HA ,tension  -Can let me know if this persists.  No real red flags.  Subjective:   Eileen Cook was seen today in follow up for Parkinsons disease.  My previous records were reviewed prior to todays visit as well as outside records available to me.  Patient has had no falls since our last visit.  She was started on levodopa when she was seen at [facility], but was only taking it once per day when I saw her last visit.  I told her that it really does not work that way and we told her to at least increase it to twice per day, in hopes that we will be able to increase it further.  She was able to get up to tid levodopa but is taking it at 7am/1pm/9pm.  Some HA x 1 week - L frontal.  Not that bothersome, but it is noticeable.  It is at a very specific place in the left frontal region.  Took Tylenol 1 evening, but otherwise has not really taken medication.  Noting some constipation.  Current prescribed movement disorder medications:  carbidopa/levodopa 25/100 CR, at 7 AM and 11 AM    PREVIOUS MEDICATIONS: Carbidopa/levodopa 25/100 IR (EDS)  ALLERGIES:   Allergies  Allergen Reactions   Codeine    Crestor [Rosuvastatin] Other (See  Comments)    CURRENT MEDICATIONS:  Outpatient Encounter Medications as of 09/18/2022  Medication Sig   acyclovir (ZOVIRAX) 200 MG capsule Take 1 capsule (200 mg total) by mouth 2 (two) times daily.   Boswellia-Glucosamine-Vit D (GLUCOSAMINE COMPLEX PO) Take 1 tablet by mouth daily.   Calcium Carbonate-Vit D-Min (CALCIUM 1200 PO) Take 1 tablet by mouth daily.    carbidopa-levodopa (SINEMET IR) 25-100 MG tablet Take 1 tablet by mouth 3 (three) times daily. 7am/11am/4pm   cholecalciferol (VITAMIN D) 1000 units tablet Take 2,000 Units by mouth daily.   Multiple Vitamin (MULTIVITAMIN) tablet Take 1 tablet by mouth daily.   simvastatin (ZOCOR) 20 MG tablet Take 1 tablet by mouth daily.   vitamin C (ASCORBIC ACID) 250 MG tablet Take 250 mg by mouth daily.   No facility-administered encounter medications on file as of 09/18/2022.    Objective:   PHYSICAL EXAMINATION:    VITALS:   Vitals:   09/18/22 1104  BP: 128/73  Pulse: 81  SpO2: 99%  Weight: 175 lb 9.6 oz (79.7 kg)  Height: '5\' 8"'$  (1.727 m)      Wt Readings from Last 3 Encounters:  09/18/22 175 lb 9.6 oz (79.7 kg)  03/13/22 172 lb 3.2 oz (78.1 kg)  09/14/21 177 lb 6.4 oz (80.5 kg)     GEN:  The patient appears stated age and  is in NAD. HEENT:  Normocephalic, atraumatic.  The mucous membranes are moist.  Cardiovascular: Regular rate rhythm Lungs: Clear to auscultation bilaterally Neck: No bruits  Neurological examination:  Orientation: The patient is alert and oriented x3. Cranial nerves: There is good facial symmetry with minimal facial hypomimia. The speech is fluent and clear. Soft palate rises symmetrically and there is no tongue deviation. Hearing is intact to conversational tone. Sensation: Sensation is intact to light touch throughout Motor: Strength is at least antigravity x4.  Movement examination: Tone: There is nl tone in the UE Abnormal movements: There is just mild right upper extremity rest tremor today  and rare LUE rest tremor.  This is the same as last visit Coordination:  There is rare decremation with finger taps on the right Gait and Station: The patient has no difficulty arising out of a deep-seated chair without the use of the hands. The patient's stride length is good today.   Cc:  Crist Infante, MD

## 2022-09-12 NOTE — Telephone Encounter (Signed)
Called patient regarding her appointment with our office and [place] [facility]

## 2022-09-18 ENCOUNTER — Encounter: Payer: Self-pay | Admitting: Neurology

## 2022-09-18 ENCOUNTER — Ambulatory Visit: Payer: Medicare HMO | Admitting: Neurology

## 2022-09-18 VITALS — BP 128/73 | HR 81 | Ht 68.0 in | Wt 175.6 lb

## 2022-09-18 DIAGNOSIS — R519 Headache, unspecified: Secondary | ICD-10-CM | POA: Diagnosis not present

## 2022-09-18 DIAGNOSIS — K5901 Slow transit constipation: Secondary | ICD-10-CM | POA: Diagnosis not present

## 2022-09-18 DIAGNOSIS — G20A1 Parkinson's disease without dyskinesia, without mention of fluctuations: Secondary | ICD-10-CM

## 2022-09-18 NOTE — Patient Instructions (Addendum)
Constipation and Parkinson's disease:  1.Rancho recipe for constipation in Parkinsons Disease:  -1 cup of unprocessed bran (need to get this at Whole Foods, Fresh Market or similar type of store), 2 cups of applesauce in 1 cup of prune juice 2.  Increase fiber intake (Metamucil,vegetables) 3.  Regular, moderate exercise can be beneficial. 4.  Avoid medications causing constipation, such as medications like antacids with calcium or magnesium 5.  It's okay to take daily Miralax, and taper if stools become too loose or you experience diarrhea 6.  Stool softeners (Colace) can help with chronic constipation and I recommend you take this daily. 7.  Increase water intake.  You should be drinking 1/2 gallon of water a day as long as you have not been diagnosed with congestive heart failure or renal/kidney failure.  This is probably the single greatest thing that you can do to help your constipation.  Local and Online Resources for Power over Parkinson's Group  October 2023    LOCAL [place] PARKINSON'S GROUPS   Power over Parkinson's Group:    Power Over Parkinson's Patient Education Group will be Wednesday, October 11th-*Hybrid meting*- in person at [place] [facility] location and via WEBEX at 2 pm.   Upcoming Power over Parkinson's Meetings:  2nd Wednesdays of the month at 2 pm:   October 11th, November 8th, December 13th  Contact Amy Marriott at [email or web address] if interested in participating in this group    LOCAL EVENTS AND NEW OFFERINGS  New PWR! Moves Community Fitness Instructor-Led Classes offering at [employer]!  TUESDAYS and Wednesdays 1-2 pm.   Contact Christy Weaver at  [email or web address] or Mike Sabin at [employer], [email or web address]  Dance for Parkinson 's classes will be on Tuesdays 9:30am-10:30am starting October 3-December 12 with a break the week of November 21st. Located in the [facility] [place] [facility] which is in the first floor of the  [facility] ([address] To register:  [email or web address] or [phone]  Drumming for Parkinson's will be held on 2nd and 4th Mondays at 11:00 am.   Located at the [employer] ([address] [place].)  Contact Jane Maydian at [email or web address] or [phone]  Through support from the Parkinson's Foundation, the Dance and Drumming for Parkinson's classes are free for both patients and caregivers.    [employer] Parkinson's Tai Chi Class, Mondays at 11 am.  Call [phone] for details   ONLINE EDUCATION AND SUPPORT  Parkinson Foundation:  www.parkinson.org  PD Health at Home continues:  Mindfulness Mondays, Wellness Wednesdays, Fitness Fridays   Upcoming Education:    Parkinson's 101:  What you and your family should know.  Wednesday, Oct. 4th 1-2 pm  Expert Briefing:     Parkinson's and the Gut-Brain Connection.  Wednesday, Oct. 11th 1-2 pm  Hallucinations and Delusions in Parkinson's.  Wednesday, Nov. 8th, 1-2 pm  Register for expert briefings (webinars) at https://www.[email or web address].org/resources-support/online-education/expert-briefings-webinars  Please check out their website to sign up for emails and see their full online offerings      Michael J Fox Foundation:  www.[email or web address].org   Third Thursday Webinars:  On the third Thursday of every month at 12 p.m. ET, join our free live webinars to learn about various aspects of living with Parkinson's disease and our work to speed medical breakthroughs.  Upcoming Webinar:  Estate Planning Tips for Increasing your Financial Wellness. (Replay).  Thursday, Oct. 12th at 12 noon  Check out additional information on their website to see their full online offerings      Davis Phinney Foundation:  www.davisphinneyfoundation.org  Upcoming Webinar:   Stay tuned  Webinar Series:  Living with Parkinson's Meetup.   Third Thursdays each month, 3 pm  Care Partner Monthly Meetup.  With  Connie Carpenter Phinney.  First Tuesday of each month, 2 pm  Check out additional information to Live Well Today on their website    Parkinson and Movement Disorders (PMD) Alliance:  www.pmdalliance.org  Ne[employer] Online:  Online Education Events  Sign up for emails, which are sent weekly to give you updates on programming and online offerings    Parkinson's Association of [place]:  www.parkinsonassociation.org  Information on online support groups, education events, and online exercises including Yoga, Parkinson's exercises and more-LOTS of information on links to PD resources and online events  Virtual Support Group through Parkinson's Association of [place]; next one is scheduled for Wednesday, October 4th at 2 pm.  (These are typically scheduled for the 1st Wednesday of the month at 2 pm).  Visit website for details.   MOVEMENT AND EXERCISE OPPORTUNITIES  PWR! Moves Classes at [facility].  Wednesdays 10 and 11 am.   Contact Amy Marriott, PT [email or web address] if interested.  NEW PWR! Moves Class offerings at [employer].  *TUESDAYS* and Wednesdays 1-2 pm.  Contact Christy Weaver at  [email or web address] or Mike Sabin at [employer],  [email or web address]  Parkinson's Wellness Recovery (PWR! Moves)  www.pwr4life.org  Info on the PWR! Virtual Experience:  You will have access to our expertise?through self-assessment, guided plans that start with the PD-specific fundamentals, educational content, tips, Q&A with an expert, and a growing library of PD-specific pre-recorded and live exercise classes of varying types and intensity - both physical and cognitive! If that is not enough, we offer 1:1 wellness consultations (in-person or virtual) to personalize your PWR! Virtual Experience.   Parkinson Foundation Fitness Fridays:   As part of the PD Health @ Home program, this free video series focuses each week on one aspect of fitness designed to  support people living with Parkinson's.? These weekly videos highlight the Parkinson Foundation fitness guidelines for people with Parkinson's disease.  www.parkinson.org/resources-support/online-education/pdhealth#ff  Dance for PD website is offering free, live-stream classes throughout the week, as well as links to digital library of classes:  https://danceforparkinsons.org/  Virtual dance and Pilates for Parkinson's classes: Click on the Community Tab> Parkinson's Movement Initiative Tab.  To register for classes and for more information, visit www.americandancefestival.org and click the "community" tab.   [employer] Parkinson's Cycling Classes   [employer]:  Thursdays @ Noon-Live classes at [employer] (Contact Margaret Hazen at [email or web address]?or [phone])  [facility] [employer]: Virtual Classes Mondays and Thursdays /Live classes Tuesday, Wednesday and Thursday (contact Marlee at [email or web address] ?or [phone])  [place] [facility] [employer]ing  Varied levels of classes are offered Tuesdays and Thursdays at [employer] [facility].   Stretching with Maria weekly class is also offered for people with Parkinson's  To observe a class or for more information, call [phone] or email Hillary Savage at [email or web address]   ADDITIONAL SUPPORT AND RESOURCES  Well[employer]:Online Caregiver Education Opportunities:  www.well-springsolutions.org/caregiver-education/caregiver-support-group.  You may also contact Jodi Kolada at [email or web address] or [phone].     [facility] Navigator:  Just1Navigator program, a?free service to help individuals and families through the journey of determining care for older adults.  The "Navigator" is a social worker, Nicole Reynolds, who will speak with a prospective client and/or loved ones to provide an assessment of the situation and a set of recommendations for a personalized care   plan -- all free of charge, and  whether?[employer] offers the needed service or not. If the need is not a service we provide, we are well-connected with reputable programs in town that we can refer you to.  www.well-springsolutions.org or to speak with the Navigator, call [phone].      

## 2022-12-15 ENCOUNTER — Other Ambulatory Visit: Payer: Self-pay | Admitting: Neurology

## 2022-12-30 DIAGNOSIS — H1031 Unspecified acute conjunctivitis, right eye: Secondary | ICD-10-CM | POA: Diagnosis not present

## 2022-12-31 DIAGNOSIS — R5383 Other fatigue: Secondary | ICD-10-CM | POA: Diagnosis not present

## 2022-12-31 DIAGNOSIS — J0100 Acute maxillary sinusitis, unspecified: Secondary | ICD-10-CM | POA: Diagnosis not present

## 2022-12-31 DIAGNOSIS — Z1152 Encounter for screening for COVID-19: Secondary | ICD-10-CM | POA: Diagnosis not present

## 2022-12-31 DIAGNOSIS — R051 Acute cough: Secondary | ICD-10-CM | POA: Diagnosis not present

## 2022-12-31 DIAGNOSIS — J3489 Other specified disorders of nose and nasal sinuses: Secondary | ICD-10-CM | POA: Diagnosis not present

## 2023-01-27 ENCOUNTER — Encounter: Payer: Self-pay | Admitting: Neurology

## 2023-02-24 ENCOUNTER — Telehealth: Payer: Self-pay | Admitting: Anesthesiology

## 2023-02-24 ENCOUNTER — Other Ambulatory Visit: Payer: Self-pay

## 2023-02-24 MED ORDER — CARBIDOPA-LEVODOPA 25-100 MG PO TABS: ORAL_TABLET | ORAL | 0 refills | Status: DC

## 2023-02-24 NOTE — Telephone Encounter (Signed)
Reorder 02/24/23

## 2023-02-24 NOTE — Telephone Encounter (Signed)
Pt called requesting a refill on her medication.   1. Which medications need refilled? Carbidopa-Levodopa 25-100 mg   2. Which pharmacy/location is medication to be sent to? [facility] on [address]  3. Do they need a 30 day or 90 day supply? 90 day supply

## 2023-02-26 ENCOUNTER — Other Ambulatory Visit: Payer: Self-pay | Admitting: Neurology

## 2023-03-12 DIAGNOSIS — H04123 Dry eye syndrome of bilateral lacrimal glands: Secondary | ICD-10-CM | POA: Diagnosis not present

## 2023-03-12 DIAGNOSIS — Z961 Presence of intraocular lens: Secondary | ICD-10-CM | POA: Diagnosis not present

## 2023-03-12 DIAGNOSIS — H5213 Myopia, bilateral: Secondary | ICD-10-CM | POA: Diagnosis not present

## 2023-03-24 NOTE — Progress Notes (Signed)
Assessment/Plan:   1.  Parkinsons Disease, with diagnosis in January, 2020  -she will take carbidopa/levodopa 25/100 CR but move timing from 7am/noon/5pm. Currently taking the last at 9pm.    -discussed exercise  -We discussed that it used to be thought that levodopa would increase risk of melanoma but now it is believed that Parkinsons itself likely increases risk of melanoma. she is to get regular skin checks and she does that.  She sees Dr. Emily Filbert for dermatology  2.  Chronic low back pain  -following with neurosx/Dr. Dawley.  This affects her ability to exercise.   Subjective:   Eileen Cook was seen today in follow up for Parkinsons disease.  My previous records were reviewed prior to todays visit as well as outside records available to me.  Patient has had no falls since our last visit.  Patient is on 3 times per day levodopa.  Last time, we discussed moving the last 1 from bedtime to closer to 5 PM.  She reports that she is still doing the last at 9pm.  No falling.  She is walking for exercise.  She is getting ready to start barre class.  Her sister did just die.  Current prescribed movement disorder medications:  carbidopa/levodopa 25/100 CR, at 7 AM/noon/5 PM    PREVIOUS MEDICATIONS: Carbidopa/levodopa 25/100 IR (EDS)  ALLERGIES:   Allergies  Allergen Reactions   Codeine    Crestor [Rosuvastatin] Other (See Comments)    CURRENT MEDICATIONS:  Outpatient Encounter Medications as of 03/31/2023  Medication Sig   acyclovir (ZOVIRAX) 200 MG capsule Take 1 capsule (200 mg total) by mouth 2 (two) times daily.   Boswellia-Glucosamine-Vit D (GLUCOSAMINE COMPLEX PO) Take 1 tablet by mouth daily.   Calcium Carbonate-Vit D-Min (CALCIUM 1200 PO) Take 1 tablet by mouth daily.    carbidopa-levodopa (SINEMET IR) 25-100 MG tablet TAKE 1 TABLET BY MOUTH THREE TIMES DAILY(AT 7:00 AM, 11:00 AM AND 4:00 PM)   cholecalciferol (VITAMIN D) 1000 units tablet Take 2,000 Units by mouth daily.    Multiple Vitamin (MULTIVITAMIN) tablet Take 1 tablet by mouth daily.   simvastatin (ZOCOR) 20 MG tablet Take 1 tablet by mouth daily.   vitamin C (ASCORBIC ACID) 250 MG tablet Take 250 mg by mouth daily.   No facility-administered encounter medications on file as of 03/31/2023.    Objective:   PHYSICAL EXAMINATION:    VITALS:   Vitals:   03/31/23 0851  BP: 122/78  Pulse: 63  SpO2: 98%  Weight: 171 lb 6.4 oz (77.7 kg)  Height: 5' 8.5" (1.74 m)    Wt Readings from Last 3 Encounters:  03/31/23 171 lb 6.4 oz (77.7 kg)  09/18/22 175 lb 9.6 oz (79.7 kg)  03/13/22 172 lb 3.2 oz (78.1 kg)     GEN:  The patient appears stated age and is in NAD. HEENT:  Normocephalic, atraumatic.  The mucous membranes are moist.  Cardiovascular: Regular rate rhythm Lungs: Clear to auscultation bilaterally Neck: No bruits  Neurological examination:  Orientation: The patient is alert and oriented x3. Cranial nerves: There is good facial symmetry with minimal facial hypomimia. The speech is fluent and clear. Soft palate rises symmetrically and there is no tongue deviation. Hearing is intact to conversational tone. Sensation: Sensation is intact to light touch throughout Motor: Strength is at least antigravity x4.  Movement examination: Tone: There is nl tone in the UE Abnormal movements: There is minimal tremor in the thumbs b/l Coordination:  There is no  decremation with any form of RAMS, including alternating supination and pronation of the forearm, hand opening and closing, finger taps, heel taps and toe taps.  Gait and Station: The patient has no difficulty arising out of a deep-seated chair without the use of the hands. The patient's stride length is good today.   Cc:  Rodrigo Ran, MD

## 2023-03-26 ENCOUNTER — Ambulatory Visit: Payer: Medicare HMO | Admitting: Neurology

## 2023-03-31 ENCOUNTER — Encounter: Payer: Self-pay | Admitting: Neurology

## 2023-03-31 ENCOUNTER — Ambulatory Visit: Payer: Medicare HMO | Admitting: Neurology

## 2023-03-31 VITALS — BP 122/78 | HR 63 | Ht 68.5 in | Wt 171.4 lb

## 2023-03-31 DIAGNOSIS — G20A1 Parkinson's disease without dyskinesia, without mention of fluctuations: Secondary | ICD-10-CM

## 2023-03-31 NOTE — Patient Instructions (Signed)
Local and Online Resources for Power over Parkinson's Group  April 2024   LOCAL [place] PARKINSON'S GROUPS   Power over Parkinson's Group:    Power Over Parkinson's Patient Education Group will be Wednesday, April 10th-*Hybrid meting*- in person at [place] [facility] location and via WEBEX, 2:00-3:00 pm.   Power over Parkinson's and Care Partner Groups will meet together, with plans for separate break out session for caregivers, depending on topic/speaker Upcoming Power over Parkinson's Meetings/Care Partner Support:  2nd Wednesdays of the month at 2 pm:   April 10th, May 8th Contact Amy Marriott at [email or web address] if interested in participating in this group    LOCAL EVENTS AND NEW OFFERINGS  NEW:  Parkinson's Social Game Night.  First Thursday of each month, 2:00-4:00 pm.  *Next date is April 4th*.  [facility], [place].  Contact [email or web address] if interested. Parkinson's CarePartner Group for Men is in the works, if interested email [email or web address] [employer] Chair Yoga classes "Train and Gain", Fridays 10 am, [employer].  Contact Gina at [phone].  Community Fitness Instructor-Led Parkinson's Exercises Classes offering at [employer]!  TUESDAYS (Chair Yoga)  and Wednesdays (PWR! Moves)  1:00 pm.   Contact Christy Weaver at  [email or web address]  or [phone]  Drumming for Parkinson's will be held on 2nd and 4th Mondays at 11:00 am.   Located at the [employer] ([address] [place].)  Contact Jane Maydian at [email or web address] or [phone]  Dance for Parkinson 's classes will be on Tuesdays 10-11 am. Located in the [employer] [facility], in the first floor of the [facility] ([address] To register:  [email or web address] or [phone] [employer] Parkinson's Tai Chi Class, Mondays at 11 am.  Call [phone] for  details Hamil-Kerr Challenge.  Bike, Run, Walk Fundraiser for Parkinson's.  Saturday, April 6th at [place].  To register, visit [email or web address] Moving Day [place].  Saturday, May 4th, 10 am start.  Register at [email or web address]    ONLINE EDUCATION AND SUPPORT  Parkinson Foundation:  www.parkinson.org  PD Health at Home continues:  Mindfulness Mondays, Wellness Wednesdays, Fitness Fridays  (PWR! Moves as part of Fitness Fridays March 22nd, 1-1:45 pm) Upcoming Education:   Parkinson's 101.  Wednesday, April 3rd, 1-2 pm Movement for Parkinson's.  Wednesday, May 1st, 1-2 pm Expert Briefing:  Research Update:  Working to Halt PD.  Wednesday, April 10th, 1-2 pm Trouble with Zzz's:  Sleep Challenges with Parkinson's.  Wed, May 8th 1-2 pm Register for virtual education and expert briefings (webinars) at www.[email or web address].org/resources-support/online-education Please check out their website to sign up for emails and see their full online offerings     Michael J Fox Foundation:  www.[email or web address].org   Third Thursday Webinars:  On the third Thursday of every month at 12 p.m. ET, join our free live webinars to learn about various aspects of living with Parkinson's disease and our work to speed medical breakthroughs.  Upcoming Webinar:  Let's Talk Taboos:  Hard-to-Discuss Parkinson's Symptoms.  Thursday, April 18th at 12 noon. Check out additional information on their website to see their full online offerings    Davis Phinney Foundation:  www.davisphinneyfoundation.org  Upcoming Webinar:   [facility]gent Therapies.  Wednesday, April 2nd, 4 pm Series:  Living with Parkinson's Meetup.   Third Thursdays each month, 3 pm  Care Partner Monthly Meetup.  With Connie Carpenter Phinney.  First Tuesday of each month, 2 pm  Check out additional   information to Live Well Today on their website    Parkinson and Movement Disorders (PMD) Alliance:  www.pmdalliance.org  Ne[employer]Life  Online:  Online Education Events  Sign up for emails, which are sent weekly to give you updates on programming and online offerings    Parkinson's Association of [place]:  www.parkinsonassociation.org  Information on online support groups, education events, and online exercises including Yoga, Parkinson's exercises and more-LOTS of information on links to PD resources and online events  Virtual Support Group through Parkinson's Association of [place]; next one is scheduled for Wednesday, April 3rd  MOVEMENT AND EXERCISE OPPORTUNITIES  PWR! Moves Classes at [facility].  Wednesdays 10 and 11 am.   Contact Amy Marriott, PT [email or web address] if interested.  Parkinson's Exercise Class offerings at [employer]. *TUESDAYS* (Chair yoga) and Wednesdays (PWR! Moves)  1:00 pm.    Contact Christy Weaver at [email or web address]    Parkinson's Wellness Recovery (PWR! Moves)  www.pwr4life.org  Info on the PWR! Virtual Experience:  You will have access to our expertise?through self-assessment, guided plans that start with the PD-specific fundamentals, educational content, tips, Q&A with an expert, and a growing library of PD-specific pre-recorded and live exercise classes of varying types and intensity - both physical and cognitive! If that is not enough, we offer 1:1 wellness consultations (in-person or virtual) to personalize your PWR! Virtual Experience.   Parkinson Foundation Fitness Fridays:   As part of the PD Health @ Home program, this free video series focuses each week on one aspect of fitness designed to support people living with Parkinson's.? These weekly videos highlight the Parkinson Foundation fitness guidelines for people with Parkinson's disease.  www.parkinson.org/resources-support/online-education/pdhealth#ff  Dance for PD website is offering free, live-stream classes throughout the week, as well as links to digital library of classes:   [email or web address]://[email or web address]  Virtual dance and Pilates for Parkinson's classes: Click on the Community Tab> Parkinson's Movement Initiative Tab.  To register for classes and for more information, visit www.[email or web address] and click the "community" tab.   [employer] Parkinson's Cycling Classes   [employer]:  Thursdays @ Noon-Live classes at [employer] (Contact Margaret Hazen at [email or web address]?or [phone])  [employer]: Classes Tuesday, Wednesday and Thursday (contact Marlee at [email or web address] ?or [phone])  [place] [employer]  Varied levels of classes are offered Tuesdays and Thursdays at [employer] [facility].   Stretching with Maria weekly class is also offered for people with Parkinson's  To observe a class or for more information, call [phone] or email Hillary Savage at [email or web address]   ADDITIONAL SUPPORT AND RESOURCES  Well[employer]:  Online Caregiver Education Opportunities:  www.well-springsolutions.org/caregiver-education/caregiver-support-group.  You may also contact Jodi Kolada at [email or web address] or [phone].     [employer] April Offerings National HealthCare Decisions Day:  The Most Critical Legal and Medical Decisions to Consider Now!  Tuesday, April 16th, 1-3 pm at [facility] at [address].  Contact Jodi Kolada at [email or web address] or [phone] Powerful Tools for Caregivers.  6 week educational series for caregivers.  April 18-May 23, 10:30 am-12:15 pm at [facility] 3rd Floor Conference Room.   Contact Jodi Kolada at [email or web address] or [phone] to register Well-Spring Navigator:  Just1Navigator program, a?free service to help individuals and families through the journey of determining care for older adults.  The "Navigator" is a social worker, Nicole Reynolds, who will speak with a prospective client and/or loved  ones to provide an assessment of the situation and a set of recommendations for a personalized care   plan -- all free of charge, and whether?Well[employer]Spring [employer] offers the needed service or not. If the need is not a service we provide, we are well-connected with reputable programs in town that we can refer you to.  www.well-springsolutions.org or to speak with the Navigator, call [phone].     

## 2023-06-03 ENCOUNTER — Other Ambulatory Visit: Payer: Self-pay | Admitting: Neurology

## 2023-06-03 DIAGNOSIS — G20A1 Parkinson's disease without dyskinesia, without mention of fluctuations: Secondary | ICD-10-CM

## 2023-06-18 DIAGNOSIS — E7849 Other hyperlipidemia: Secondary | ICD-10-CM | POA: Diagnosis not present

## 2023-06-18 DIAGNOSIS — E785 Hyperlipidemia, unspecified: Secondary | ICD-10-CM | POA: Diagnosis not present

## 2023-06-18 DIAGNOSIS — I2510 Atherosclerotic heart disease of native coronary artery without angina pectoris: Secondary | ICD-10-CM | POA: Diagnosis not present

## 2023-06-18 DIAGNOSIS — Z0000 Encounter for general adult medical examination without abnormal findings: Secondary | ICD-10-CM | POA: Diagnosis not present

## 2023-06-18 DIAGNOSIS — E559 Vitamin D deficiency, unspecified: Secondary | ICD-10-CM | POA: Diagnosis not present

## 2023-06-24 DIAGNOSIS — Z1212 Encounter for screening for malignant neoplasm of rectum: Secondary | ICD-10-CM | POA: Diagnosis not present

## 2023-06-25 DIAGNOSIS — A6000 Herpesviral infection of urogenital system, unspecified: Secondary | ICD-10-CM | POA: Diagnosis not present

## 2023-06-25 DIAGNOSIS — R011 Cardiac murmur, unspecified: Secondary | ICD-10-CM | POA: Diagnosis not present

## 2023-06-25 DIAGNOSIS — Z0000 Encounter for general adult medical examination without abnormal findings: Secondary | ICD-10-CM | POA: Diagnosis not present

## 2023-06-25 DIAGNOSIS — M8580 Other specified disorders of bone density and structure, unspecified site: Secondary | ICD-10-CM | POA: Diagnosis not present

## 2023-06-25 DIAGNOSIS — R82998 Other abnormal findings in urine: Secondary | ICD-10-CM | POA: Diagnosis not present

## 2023-06-25 DIAGNOSIS — M1990 Unspecified osteoarthritis, unspecified site: Secondary | ICD-10-CM | POA: Diagnosis not present

## 2023-06-25 DIAGNOSIS — I700 Atherosclerosis of aorta: Secondary | ICD-10-CM | POA: Diagnosis not present

## 2023-06-25 DIAGNOSIS — H9190 Unspecified hearing loss, unspecified ear: Secondary | ICD-10-CM | POA: Diagnosis not present

## 2023-06-25 DIAGNOSIS — R251 Tremor, unspecified: Secondary | ICD-10-CM | POA: Diagnosis not present

## 2023-06-25 DIAGNOSIS — E785 Hyperlipidemia, unspecified: Secondary | ICD-10-CM | POA: Diagnosis not present

## 2023-06-25 DIAGNOSIS — G20A1 Parkinson's disease without dyskinesia, without mention of fluctuations: Secondary | ICD-10-CM | POA: Diagnosis not present

## 2023-06-25 DIAGNOSIS — M542 Cervicalgia: Secondary | ICD-10-CM | POA: Diagnosis not present

## 2023-06-25 DIAGNOSIS — M4850XS Collapsed vertebra, not elsewhere classified, site unspecified, sequela of fracture: Secondary | ICD-10-CM | POA: Diagnosis not present

## 2023-07-10 ENCOUNTER — Encounter: Payer: Self-pay | Admitting: Neurology

## 2023-07-30 DIAGNOSIS — Z6825 Body mass index (BMI) 25.0-25.9, adult: Secondary | ICD-10-CM | POA: Diagnosis not present

## 2023-07-30 DIAGNOSIS — M5136 Other intervertebral disc degeneration, lumbar region: Secondary | ICD-10-CM | POA: Diagnosis not present

## 2023-08-06 DIAGNOSIS — L720 Epidermal cyst: Secondary | ICD-10-CM | POA: Diagnosis not present

## 2023-08-06 DIAGNOSIS — L578 Other skin changes due to chronic exposure to nonionizing radiation: Secondary | ICD-10-CM | POA: Diagnosis not present

## 2023-08-06 DIAGNOSIS — D225 Melanocytic nevi of trunk: Secondary | ICD-10-CM | POA: Diagnosis not present

## 2023-08-06 DIAGNOSIS — L821 Other seborrheic keratosis: Secondary | ICD-10-CM | POA: Diagnosis not present

## 2023-08-06 DIAGNOSIS — L570 Actinic keratosis: Secondary | ICD-10-CM | POA: Diagnosis not present

## 2023-09-06 ENCOUNTER — Other Ambulatory Visit: Payer: Self-pay | Admitting: Neurology

## 2023-09-06 DIAGNOSIS — G20A1 Parkinson's disease without dyskinesia, without mention of fluctuations: Secondary | ICD-10-CM

## 2023-09-12 ENCOUNTER — Other Ambulatory Visit: Payer: Self-pay

## 2023-09-12 ENCOUNTER — Other Ambulatory Visit: Payer: Self-pay | Admitting: Neurology

## 2023-09-12 ENCOUNTER — Telehealth: Payer: Self-pay

## 2023-09-12 DIAGNOSIS — G20A1 Parkinson's disease without dyskinesia, without mention of fluctuations: Secondary | ICD-10-CM

## 2023-09-12 MED ORDER — CARBIDOPA-LEVODOPA ER 25-100 MG PO TBCR: 1.0000 | EXTENDED_RELEASE_TABLET | Freq: Three times a day (TID) | ORAL | 0 refills | Status: DC

## 2023-09-12 NOTE — Telephone Encounter (Signed)
Patient received incorrect RX pharmacy sent request for Carbidopa levodopa IR and needed CR I have called pharmacy and had it taken out of her chart removed it from our chart send=t I new RX and called patient

## 2023-09-15 ENCOUNTER — Telehealth: Payer: Self-pay | Admitting: Neurology

## 2023-09-15 NOTE — Telephone Encounter (Signed)
TALKED WITH PATIENT ABOUT MEDICATION ERROR. SHE UNDERSTOOD A LITTLE CONFUSED SINCE SHE ALSO GOT A REFILL IN JULY FOR THE IR . SHE AGREED TO TRY TO CHANGE THE TIMES OF 7/12/AND 5

## 2023-09-15 NOTE — Telephone Encounter (Signed)
Patient is calling about a change to the Carbidopa- Levodopa 25-100mg  ER. I explained that the ER was Extended Release, and that it was stated on the RX ( starting on 09/12/2023) . Patient thinks she was given a different RX by the Pharmacy

## 2023-10-02 ENCOUNTER — Ambulatory Visit: Payer: Medicare HMO | Admitting: Neurology

## 2023-10-14 NOTE — Progress Notes (Unsigned)
Assessment/Plan:   1.  Parkinsons Disease, with diagnosis in January, 2020  -she will take carbidopa/levodopa 25/100 CR but move timing from 7am/noon/5pm. Make sure taking the last one early and not at bed  -she may try going back to IR.  She initially had told me it caused EDS but when she went back to it recently and did well.  She will let me know and we can change the RX if she does well with this  -We discussed Vyalev, which is foscarbidopa/foslevodopa pump that is newly FDA approved.  We discussed that it is for motor fluctuations in adults with advanced Parkinson's disease.  We discussed that this likely will not be on Medicare formulary until the latter half of 2025.  We discussed risks and benefits of this drug.  She doesn't need this  -has min dyskinesia, not bothersome to pt  -We discussed that it used to be thought that levodopa would increase risk of melanoma but now it is believed that Parkinsons itself likely increases risk of melanoma. she is to get regular skin checks and she does that.  She sees Dr. Emily Filbert for dermatology  2.  Chronic low back pain  -following with neurosx/Dr. Dawley.     Subjective:   Eileen Cook was seen today in follow up for Parkinsons disease.  My previous records were reviewed prior to todays visit as well as outside records available to me.  Overall, patient has been doing fairly well.  She has seen Dr. Jake Samples since our last visit.  She has seen her dermatologist since our last visit.  Notes available to me are reviewed.  Dr. Jake Samples recommended dry needling and if that did not help they were going to try epidural injections.  She did fall one time on concrete, as there was an uneven lip on the concrete.  She didn't get hurt.  There was a mix up at the pharmacy with her meds and she was IR and she actually did well with that but she is back on the CR  Current prescribed movement disorder medications:  carbidopa/levodopa 25/100 CR, at 7 AM/noon/5  PM  PREVIOUS MEDICATIONS: Carbidopa/levodopa 25/100 IR (EDS initially but when there was a mix up at the pharmacy she retried IR and did okay with it)  ALLERGIES:   Allergies  Allergen Reactions   Codeine    Crestor [Rosuvastatin] Other (See Comments)    CURRENT MEDICATIONS:  Outpatient Encounter Medications as of 10/16/2023  Medication Sig   acyclovir (ZOVIRAX) 200 MG capsule Take 1 capsule (200 mg total) by mouth 2 (two) times daily.   Boswellia-Glucosamine-Vit D (GLUCOSAMINE COMPLEX PO) Take 1 tablet by mouth daily.   Calcium Carbonate-Vit D-Min (CALCIUM 1200 PO) Take 1 tablet by mouth daily.    Carbidopa-Levodopa ER (SINEMET CR) 25-100 MG tablet controlled release Take 1 tablet by mouth 3 (three) times daily. 7am/noon/5pm   cholecalciferol (VITAMIN D) 1000 units tablet Take 2,000 Units by mouth daily.   ezetimibe (ZETIA) 10 MG tablet Take 10 mg by mouth daily.   Multiple Vitamin (MULTIVITAMIN) tablet Take 1 tablet by mouth daily.   simvastatin (ZOCOR) 20 MG tablet Take 1 tablet by mouth daily.   vitamin C (ASCORBIC ACID) 250 MG tablet Take 250 mg by mouth daily.   No facility-administered encounter medications on file as of 10/16/2023.    Objective:   PHYSICAL EXAMINATION:    VITALS:   Vitals:   10/16/23 1414  BP: 126/82  Pulse: 88  SpO2:  98%  Weight: 173 lb 4.8 oz (78.6 kg)  Height: 5' 8.5" (1.74 m)   Wt Readings from Last 3 Encounters:  10/16/23 173 lb 4.8 oz (78.6 kg)  03/31/23 171 lb 6.4 oz (77.7 kg)  09/18/22 175 lb 9.6 oz (79.7 kg)   GEN:  The patient appears stated age and is in NAD. HEENT:  Normocephalic, atraumatic.  The mucous membranes are moist.  Cardiovascular: Regular rate rhythm Lungs: Clear to auscultation bilaterally Neck: No bruits  Neurological examination:  Orientation: The patient is alert and oriented x3. Cranial nerves: There is good facial symmetry with minimal facial hypomimia. The speech is fluent and clear. Soft palate rises  symmetrically and there is no tongue deviation. Hearing is intact to conversational tone. Sensation: Sensation is intact to light touch throughout Motor: Strength is at least antigravity x4.  Movement examination: Tone: There is nl tone in the UE Abnormal movements: There is minimal tremor in the thumbs b/l (similar to previous); rare dyskinesia in the R shoulder Coordination:  There is no decremation with any form of RAMS, including alternating supination and pronation of the forearm, hand opening and closing, finger taps, heel taps and toe taps.  Gait and Station: The patient has no difficulty arising out of a deep-seated chair without the use of the hands. The patient's stride length is good today.  Total time spent on today's visit was 30 minutes, including both face-to-face time and nonface-to-face time.  Time included that spent on review of records (prior notes available to me/labs/imaging if pertinent), discussing treatment and goals, answering patient's questions and coordinating care.    Cc:  Rodrigo Ran, MD

## 2023-10-16 ENCOUNTER — Encounter: Payer: Self-pay | Admitting: Neurology

## 2023-10-16 ENCOUNTER — Ambulatory Visit: Payer: Medicare HMO | Admitting: Neurology

## 2023-10-16 VITALS — BP 126/82 | HR 88 | Ht 68.5 in | Wt 173.3 lb

## 2023-10-16 DIAGNOSIS — G20B1 Parkinson's disease with dyskinesia, without mention of fluctuations: Secondary | ICD-10-CM

## 2023-10-16 NOTE — Patient Instructions (Signed)
Local and Online Resources for Power over Parkinson's Group?  November 2024  ?  LOCAL [place] PARKINSON'S GROUPS??  Power over [employer] Group:???  Power Over Parkinson's Patient Education Group will be Wednesday November 13th Upcoming Power over [employer] Meetings/Care Partner Support:? 2nd Wednesdays of the month at 2 pm:  November 13th, December 11th Contact Amy Marriott at [email or web address] or Lynwood Dawley at [email or web address] if interested in participating in this group?  ?  LOCAL EVENTS AND NEW OFFERINGS?  Parkinson's Social Game Night.  First Thursday of each month, 2:00-4:00 pm.  Rossie Muskrat [facility] [place] [place].  Contact [email or web address] if interested.  Parkinson's CarePartner Group for Men is in the works, if interested email Sarah ?[email or web address]  ACT FITNESS Chair Yoga classes "Train and Gain", Fridays 10 am, [employer].  Contact Gina at [phone].   PWR! Moves [place] class!  Wednesdays at 10 am.  Please contact Lonia Blood, PT at [email or web address] if interested. Health visitor Classes offering at [employer]!? Tuesdays (Chair Yoga)  and Wednesdays (PWR! Moves)  1:00 pm.?? Contact Aldona Lento [phone] or [email or web address] Drumming for Parkinson's will be held on 2nd and 4th Mondays at 11:00 am.?? Located at the [place] of the [employer] ([address] [place].)? Contact Albertina Parr at [email or web address] or [phone]?  [employer] Parkinson's Tai Chi Class, Mondays at 11 am.  Call [phone] for details  [facility] at [facility] [address], [place] Wednesdays- 4:00 - 5:00 PM - specifically for Parkinson's Disease.  Free!  Contact Denny Peon, [place] - [phone] (clinic) or  [phone] (cell) or by email: [email or web address]   ?ONLINE EDUCATION AND  SUPPORT?  Parkinson Foundation:? www.parkinson.org?  PD Health at Home continues:? Mindfulness Mondays, Wellness Wednesdays, Fitness Fridays??  Upcoming Education:??  Gene and Cell Based therapies in Parkinson's.  Wednesday, October 30th, 1-2 pm Care Partner Summit:  Discovering Adaptability and Resilience.  Saturday, November 2nd, 1-3 pm  Care Partner Summit:  Designer, fashion/clothing.  Wednesday, November 20th, 1-2 pm  Expert Briefing:    What's on your mind?  Thinking and memory changes.  Wednesday, November 13th, 1-2 pm Register for virtual education and Photographer (webinars) at [email or web address]  Please check out their website to sign up for emails and see their full online offerings??  ?  [employer] Foundation:? www.[email or web address]??  Third Thursday Webinars:? On the third Thursday of every month at 12 p.m. ET, join our free live webinars to learn about various aspects of living with Parkinson's disease and our work to speed medical breakthroughs.?  Upcoming Webinar:? Year of Momentum:  What Parkinson's Research Accomplished in 2024.  Thursday, November 21st  at 12 noon.  Check out additional information on their website to see their full online offerings?  ?  [employer]:? www.[email or web address]?  Upcoming Webinar:   Living Well with Advancing Parkinson's:  Tips and Tools for Care Partners and People with Parkinson's.  Friday, November 8th, 3 pm Series:? Living with Parkinson's Meetup.?? Third Thursdays each month, 3 pm?  Care Partner Monthly Meetup.? With Jillene Bucks [place].? First Tuesday of each month, 2 pm?  Check out additional information to Live Well Today on their website?  ?  Parkinson and Movement Disorders (PMD) Alliance:? www.pmdalliance.org?  [employer] Online:? Online Education Events?  Sign up for emails, which are sent weekly to give you updates on programming and online offerings?  ?  Parkinson's  Association of [place]:? www.parkinsonassociation.org?  Information on online  support groups, education events, and online exercises including Yoga, Parkinson's exercises and more-LOTS of information on links to PD resources and online events?  Virtual Support Group through [employer] of the [place]-November 6th at 2 pm   MOVEMENT AND EXERCISE OPPORTUNITIES?  PWR! Moves [place] class has returned!  Wednesdays at 10 am.  Please contact Lonia Blood, PT at [email or web address] if interested. Parkinson's Exercise Class offerings at [employer]. Tuesdays (Chair yoga) and Wednesdays (PWR! Moves)  1:00 pm.?  Contact Aldona Lento [phone] or [email or web address]  Parkinson's Wellness Recovery (PWR! Moves)? www.pwr4life.org?  Info on the PWR! Virtual Experience:? You will have access to our expertise?through self-assessment, guided plans that start with the PD-specific fundamentals, educational content, tips, Q&A with an expert, and a growing Engineering geologist of PD-specific pre-recorded and live exercise classes of varying types and intensity - both physical and cognitive! If that is not enough, we offer 1:1 wellness consultations (in-person or virtual) to personalize your PWR! Dance movement psychotherapist.??  Parkinson [employer] Fridays:??  As part of the PD Health @ Home program, this free video series focuses each week on one aspect of fitness designed to support people living with Parkinson's.? These weekly videos highlight the Parkinson Foundation fitness guidelines for people with Parkinson's disease.?  [email or web address].[email or web address]?  Dance for PD website is offering free, live-stream classes throughout the week, as well as links to [employer] of classes:? [email or web address]/?  Virtual dance and Pilates for Parkinson's classes: Click on the Community Tab> Parkinson's Movement Initiative Tab.? To register for classes  and for more information, visit www.[email or web address].co.[email or web address] and click the "community" tab.??  [employer] Parkinson's Cycling Classes??  [employer]:? Thursdays @ Noon-Live classes at [employer] ([employer] at [email or web address]?or [phone])?  [facility] [employer]: Classes Tuesday, Wednesday and Thursday (contact [place] at [email or web address] ?or [phone])?  [employer]?  Varied levels of classes are offered Tuesdays and Thursdays at [facility].??  Stretching with Byrd Hesselbach weekly class is also offered for people with Parkinson's?  To observe a class or for more information, call [phone] or email Patricia Nettle at [email or web address]?    ADDITIONAL SUPPORT AND RESOURCES?  [employer]:  Chiropractor:? www.well-springsolutions.org/caregiver-education/caregiver-support-group.? You may also contact Loleta Chance at [facility] -spring.org or [phone].????  Well-Spring Navigator:? Just1Navigator program, a?free service to help individuals and families through the journey of determining care for older adults.? The "Navigator" is a Child psychotherapist, Sidney Ace, who will speak with a prospective client and/or loved ones to provide an assessment of the situation and a set of recommendations for a personalized care plan -- all free of charge, and whether?[employer] offers the needed service or not. If the need is not a service we provide, we are well-connected with reputable programs in town that we can refer you to.? www.well-springsolutions.org or to speak with the Navigator, call [phone].?

## 2023-11-06 DIAGNOSIS — R051 Acute cough: Secondary | ICD-10-CM | POA: Diagnosis not present

## 2023-11-06 DIAGNOSIS — J069 Acute upper respiratory infection, unspecified: Secondary | ICD-10-CM | POA: Diagnosis not present

## 2023-11-08 ENCOUNTER — Other Ambulatory Visit: Payer: Self-pay | Admitting: Neurology

## 2023-11-08 DIAGNOSIS — G20A1 Parkinson's disease without dyskinesia, without mention of fluctuations: Secondary | ICD-10-CM

## 2023-11-14 ENCOUNTER — Telehealth: Payer: Self-pay | Admitting: Neurology

## 2023-11-14 NOTE — Telephone Encounter (Signed)
Pt called in and left a message. She was trying to get her Carbidopa-levodopa ER filled, but they said it is too early. She is going out of town. She is wanting to know if she can take the ER and the regular carbidopa-levodopa interchangeably?

## 2023-11-17 NOTE — Telephone Encounter (Signed)
Called pharmacy and the insurance will not let us fill it.  Patient said she thinks she has enough to get a RX she can call any [facility] and get it transferred. Patient has been mixing IR and ER. I told patient this is not recommended Dr. Arbutus Leas is out of the office

## 2023-11-17 NOTE — Telephone Encounter (Signed)
Patient stated she only has mixed the IR and CR as she was going out of town and was unable to refill before she left. She stated she had a full bottle of the IR and if she hadn't been going out of town she wouldn't have run out

## 2023-12-05 DIAGNOSIS — I2510 Atherosclerotic heart disease of native coronary artery without angina pectoris: Secondary | ICD-10-CM | POA: Diagnosis not present

## 2023-12-05 DIAGNOSIS — E785 Hyperlipidemia, unspecified: Secondary | ICD-10-CM | POA: Diagnosis not present

## 2023-12-24 DIAGNOSIS — Z6825 Body mass index (BMI) 25.0-25.9, adult: Secondary | ICD-10-CM | POA: Diagnosis not present

## 2023-12-24 DIAGNOSIS — E782 Mixed hyperlipidemia: Secondary | ICD-10-CM | POA: Diagnosis not present

## 2023-12-24 DIAGNOSIS — R051 Acute cough: Secondary | ICD-10-CM | POA: Diagnosis not present

## 2023-12-24 DIAGNOSIS — J111 Influenza due to unidentified influenza virus with other respiratory manifestations: Secondary | ICD-10-CM | POA: Diagnosis not present

## 2024-02-15 ENCOUNTER — Other Ambulatory Visit: Payer: Self-pay | Admitting: Neurology

## 2024-02-15 DIAGNOSIS — G20A1 Parkinson's disease without dyskinesia, without mention of fluctuations: Secondary | ICD-10-CM

## 2024-03-25 DIAGNOSIS — Z961 Presence of intraocular lens: Secondary | ICD-10-CM | POA: Diagnosis not present

## 2024-03-25 DIAGNOSIS — H524 Presbyopia: Secondary | ICD-10-CM | POA: Diagnosis not present

## 2024-03-25 DIAGNOSIS — H04123 Dry eye syndrome of bilateral lacrimal glands: Secondary | ICD-10-CM | POA: Diagnosis not present

## 2024-03-25 DIAGNOSIS — H5213 Myopia, bilateral: Secondary | ICD-10-CM | POA: Diagnosis not present

## 2024-03-26 DIAGNOSIS — K5900 Constipation, unspecified: Secondary | ICD-10-CM | POA: Diagnosis not present

## 2024-03-26 DIAGNOSIS — K5790 Diverticulosis of intestine, part unspecified, without perforation or abscess without bleeding: Secondary | ICD-10-CM | POA: Diagnosis not present

## 2024-03-26 DIAGNOSIS — M5136 Other intervertebral disc degeneration, lumbar region with discogenic back pain only: Secondary | ICD-10-CM | POA: Diagnosis not present

## 2024-03-26 DIAGNOSIS — K625 Hemorrhage of anus and rectum: Secondary | ICD-10-CM | POA: Diagnosis not present

## 2024-03-26 DIAGNOSIS — K644 Residual hemorrhoidal skin tags: Secondary | ICD-10-CM | POA: Diagnosis not present

## 2024-04-08 NOTE — Progress Notes (Unsigned)
 Assessment/Plan:   1.  Parkinsons Disease, with diagnosis in January, 2020  -she will take carbidopa /levodopa  25/100 CR tid  -has min dyskinesia, not bothersome to pt  -We discussed that it used to be thought that levodopa  would increase risk of melanoma but now it is believed that Parkinsons itself likely increases risk of melanoma. she is to get regular skin checks and she does that.  She sees Dr. Joanne Muckle for dermatology  2.  Chronic low back pain  -following with neurosx (previously Dr. Julane Ny).  She was thinking about going to [facility] since Dr. Julane Ny had left.  She was given the names of Dr. Jeris Montes and Felipe Horton in case she wanted to stay within the [facility] system.   Subjective:   Eileen Cook was seen today in follow up for Parkinsons disease.  My previous records were reviewed prior to todays visit as well as outside records available to me.  Overall, patient has been doing fairly well.  She told me last visit she may go back to the IR formulation of levodopa  and she reports today that she did stay with the CR formulation.  She has had no falls.  She is doing yoga and barre and walking daily.  Her back is the biggest limitation that she has.  She is thinking about getting a second opinion at [facility] since Dr. Julane Ny has left.  Current prescribed movement disorder medications:  carbidopa /levodopa  25/100 CR, at 7 AM/noon/5 PM  PREVIOUS MEDICATIONS: Carbidopa /levodopa  25/100 IR (EDS initially but when there was a mix up at the pharmacy she retried IR and did okay with it)  ALLERGIES:   Allergies  Allergen Reactions   Codeine    Crestor [Rosuvastatin] Other (See Comments)    CURRENT MEDICATIONS:  Outpatient Encounter Medications as of 04/09/2024  Medication Sig   acyclovir  (ZOVIRAX ) 200 MG capsule Take 1 capsule (200 mg total) by mouth 2 (two) times daily.   Boswellia-Glucosamine-Vit D (GLUCOSAMINE COMPLEX PO) Take 1 tablet by mouth daily.   Calcium Carbonate-Vit D-Min (CALCIUM 1200 PO)  Take 1 tablet by mouth daily.    Carbidopa -Levodopa  ER (SINEMET  CR) 25-100 MG tablet controlled release TAKE 1 TABLET BY MOUTH THREE TIMES DAILY(7AM/NOON/5PM)..   cholecalciferol (VITAMIN D) 1000 units tablet Take 2,000 Units by mouth daily.   ezetimibe (ZETIA) 10 MG tablet Take 10 mg by mouth daily.   Multiple Vitamin (MULTIVITAMIN) tablet Take 1 tablet by mouth daily.   simvastatin (ZOCOR) 20 MG tablet Take 1 tablet by mouth daily.   vitamin C (ASCORBIC ACID) 250 MG tablet Take 250 mg by mouth daily.   No facility-administered encounter medications on file as of 04/09/2024.    Objective:   PHYSICAL EXAMINATION:    VITALS:   Vitals:   04/09/24 1444  BP: 118/72  Pulse: 82  SpO2: 98%  Weight: 172 lb 9.6 oz (78.3 kg)    Wt Readings from Last 3 Encounters:  04/09/24 172 lb 9.6 oz (78.3 kg)  10/16/23 173 lb 4.8 oz (78.6 kg)  03/31/23 171 lb 6.4 oz (77.7 kg)   GEN:  The patient appears stated age and is in NAD. HEENT:  Normocephalic, atraumatic.  The mucous membranes are moist.  Cardiovascular: Regular rate rhythm Lungs: Clear to auscultation bilaterally Neck: No bruits  Neurological examination:  Orientation: The patient is alert and oriented x3. Cranial nerves: There is good facial symmetry with minimal facial hypomimia. The speech is fluent and clear. Soft palate rises symmetrically and there is no tongue deviation.  Hearing is intact to conversational tone. Sensation: Sensation is intact to light touch throughout Motor: Strength is at least antigravity x4.  Movement examination: Tone: There is nl tone in the UE Abnormal movements: There is minimal tremor in the thumbs b/l (similar to previous); rare dyskinesia in the R shoulder (this is all stable) Coordination:  There is no decremation with any form of RAMS, including alternating supination and pronation of the forearm, hand opening and closing, finger taps, heel taps and toe taps.  Gait and Station: The patient has no  difficulty arising out of a deep-seated chair without the use of the hands.  Stride length is good.  Armswing is good.    Cc:  Aldo Hun, MD

## 2024-04-09 ENCOUNTER — Ambulatory Visit: Payer: Medicare HMO | Admitting: Neurology

## 2024-04-09 ENCOUNTER — Encounter: Payer: Self-pay | Admitting: Neurology

## 2024-04-09 VITALS — BP 118/72 | HR 82 | Wt 172.6 lb

## 2024-04-09 DIAGNOSIS — G8929 Other chronic pain: Secondary | ICD-10-CM | POA: Diagnosis not present

## 2024-04-09 DIAGNOSIS — M545 Low back pain, unspecified: Secondary | ICD-10-CM

## 2024-04-09 DIAGNOSIS — G20A1 Parkinson's disease without dyskinesia, without mention of fluctuations: Secondary | ICD-10-CM | POA: Diagnosis not present

## 2024-04-09 MED ORDER — CARBIDOPA-LEVODOPA ER 25-100 MG PO TBCR
1.0000 | EXTENDED_RELEASE_TABLET | Freq: Three times a day (TID) | ORAL | 1 refills | Status: AC
Start: 2024-04-09 — End: ?

## 2024-04-09 NOTE — Patient Instructions (Addendum)
 Drs. Felipe Horton and Jeris Montes are [facility]  DIETARY GUIDELINES Research suggests that Parkinson's disease symptoms might be improved by increasing the levels of anti-oxidant and anti-inflammatory compounds in your brain. This means eating more whole, unprocessed plant foods. The more variety and color in your diet the better! Don't spend a lot of money on supplements, spend it on fresh healthy food; food is medicine! Eat what you need to eat to be happy, but eat it as a 'treat', not as a staple food, and eat a lot more of the food that is good for your health. Every healthy lifestyle change helps and most people do better making gradual minor changes that become major over time.  1) Eat at least 7-9 servings of fruits and vegetables a day, including:  Eat lots more berries (blueberries, cherries, raspberries, goji berries, cranberries) and fruits (apples, pears, oranges, plums, prunes, apricots, peaches, nectarines, watermelon, grapefruit, pineapple, bananas) fresh, frozen or dried, all are healthy options.   Eat lots more dark leafy greens (spinach, romaine lettuce, arugula, Swiss chard, turnip greens, radicchio), cruciferous vegetables (broccoli, cauliflower, cabbage, red cabbage, collard greens, kale, brussel sprouts) and allium vegetables (onion, shallot, garlic, spring onion). Cut these vegetables up in small pieces and let them sit for a few minutes before steaming or microwaving.   Eat lots more colorful vegetables like beets, asparagus, red pepper, tomatoes, carrots, sweet potato, squash, pumpkin and other vegetables that are bright green, red, yellow and don't always eat the same ones. If you are cooking them, steam them or microwave them.  2) On your salads, make your own dressing and include a source of healthy fat (almonds, flax seeds, walnuts, cashews, tahini, sunflower seeds, extra virgin olive oil) to help absorb the anti-oxidants.  3) Drink more liquid including lots of tea  (white tea, green tea, rooibos tea, hibiscus tea, rosehip tea) and less dairy milk, soda, and clear fruit juice. Coffee, beer and wine are good too, in moderation. 4) Eat LOTS more spices (turmeric, pepper, cloves, nutmeg, oregano, basil, [place] cinnamon, thyme, rosemary, ginger). Include these in your daily oatmeal, salads, roasted vegetables, etc. Try traditional blends of spices from different cultures. 5) Eat at least 1-2 tablespoons ground flax seeds daily. Ideally, buy them whole and grind them yourself as needed. Keep them and most foods with high fat refrigerated. 6) Eat more mushrooms, cooked. Any kind of mushroom is fine. 7) Don't eat lots of fish and if you do, eat Pacific wild caught salmon or small fatty fishes like mackerel and sardines. If you want to take supplemental omega-3 fatty acids, instead of fish oil, consider algae-derived omega-3 supplements which cost more, but may be better for you because they don't have the pollutants that are in most fish oil. 8) Eat lots more legumes which include kidney beans, black beans, garbanzo beans, black-eyed peas, fava beans, navy beans, white beans, broad beans, peanuts, green lentils, red lentils, tofu, miso, edamame, soybeans, tempeh, etc. 9) Eat lots more whole grains (oatmeal, brown rice, quinoa, buckwheat, barley, spelt, faro, bulgur wheat, whole wheat or multigrain bread, whole wheat pasta, soba or buckwheat noodles etc.) and less processed grains and grain products (white rice, white bread, enriched flour, fortified flour, cakes and pastries, products that say 'made with whole grains').  10) Try to eat enough whole plant foods, with enough dietary fiber to have at least one bowel movement a day, without stool softeners or laxatives.  11) If you become a vegan, (which means giving up meat,  poultry, fish AND dairy products) consult your physician and take B 12 supplements (2500mcg once/week).    Supplements in Parkinson Disease Don't spend a  lot of money on supplements, spend it on fresh healthy food; food is medicine! The dietary guidelines above are safe and effective for people with PD (and may reduce the risk of developing PD for people at higher risk). A brief note on mucuna: Mucuna pruriens is a legume that contains L-dopa. Some health food stores and online retailers sell mucuna extract in powdered or pill form, as an "alternative medicine" -- but it's not really alternative, it's just L-dopa! The amount of L-dopa found in mucuna is not standardized, and the extract may also contain fillers that can be at worst, directly toxic to the brain. I do not recommend taking mucuna instead of levodopa . A brief note on CBD: Cannabidiols (CBD) seems to be helpful for the management of stress and anxiety in Parkinson disease. On the downside, it might worsen apathy and lack of motivation. It does not seem to have an effect on the underlying disease course. Unfortunately, until it's federally legal, we will not be able to study it in research trials and figure out a standardized dosing strategy. If you choose to try CBD oil, try to find a strain that has minimal/no THC -- this will minimize the "high" of marijuana but will still lessen your anxiety

## 2024-04-21 DIAGNOSIS — K625 Hemorrhage of anus and rectum: Secondary | ICD-10-CM | POA: Diagnosis not present

## 2024-07-15 DIAGNOSIS — E7849 Other hyperlipidemia: Secondary | ICD-10-CM | POA: Diagnosis not present

## 2024-07-15 DIAGNOSIS — Z1212 Encounter for screening for malignant neoplasm of rectum: Secondary | ICD-10-CM | POA: Diagnosis not present

## 2024-07-15 DIAGNOSIS — E559 Vitamin D deficiency, unspecified: Secondary | ICD-10-CM | POA: Diagnosis not present

## 2024-07-15 DIAGNOSIS — R030 Elevated blood-pressure reading, without diagnosis of hypertension: Secondary | ICD-10-CM | POA: Diagnosis not present

## 2024-07-15 DIAGNOSIS — E785 Hyperlipidemia, unspecified: Secondary | ICD-10-CM | POA: Diagnosis not present

## 2024-07-22 DIAGNOSIS — R82998 Other abnormal findings in urine: Secondary | ICD-10-CM | POA: Diagnosis not present

## 2024-07-22 DIAGNOSIS — I77819 Aortic ectasia, unspecified site: Secondary | ICD-10-CM | POA: Diagnosis not present

## 2024-07-22 DIAGNOSIS — E785 Hyperlipidemia, unspecified: Secondary | ICD-10-CM | POA: Diagnosis not present

## 2024-07-22 DIAGNOSIS — R030 Elevated blood-pressure reading, without diagnosis of hypertension: Secondary | ICD-10-CM | POA: Diagnosis not present

## 2024-07-22 DIAGNOSIS — Z1331 Encounter for screening for depression: Secondary | ICD-10-CM | POA: Diagnosis not present

## 2024-07-22 DIAGNOSIS — Z0000 Encounter for general adult medical examination without abnormal findings: Secondary | ICD-10-CM | POA: Diagnosis not present

## 2024-07-22 DIAGNOSIS — I700 Atherosclerosis of aorta: Secondary | ICD-10-CM | POA: Diagnosis not present

## 2024-07-22 DIAGNOSIS — I2510 Atherosclerotic heart disease of native coronary artery without angina pectoris: Secondary | ICD-10-CM | POA: Diagnosis not present

## 2024-07-22 DIAGNOSIS — K5790 Diverticulosis of intestine, part unspecified, without perforation or abscess without bleeding: Secondary | ICD-10-CM | POA: Diagnosis not present

## 2024-07-22 DIAGNOSIS — M8580 Other specified disorders of bone density and structure, unspecified site: Secondary | ICD-10-CM | POA: Diagnosis not present

## 2024-07-22 DIAGNOSIS — M1990 Unspecified osteoarthritis, unspecified site: Secondary | ICD-10-CM | POA: Diagnosis not present

## 2024-07-22 DIAGNOSIS — R011 Cardiac murmur, unspecified: Secondary | ICD-10-CM | POA: Diagnosis not present

## 2024-07-22 DIAGNOSIS — Z23 Encounter for immunization: Secondary | ICD-10-CM | POA: Diagnosis not present

## 2024-07-22 DIAGNOSIS — M4850XS Collapsed vertebra, not elsewhere classified, site unspecified, sequela of fracture: Secondary | ICD-10-CM | POA: Diagnosis not present

## 2024-07-22 DIAGNOSIS — G20A1 Parkinson's disease without dyskinesia, without mention of fluctuations: Secondary | ICD-10-CM | POA: Diagnosis not present

## 2024-07-22 DIAGNOSIS — A6000 Herpesviral infection of urogenital system, unspecified: Secondary | ICD-10-CM | POA: Diagnosis not present

## 2024-08-05 DIAGNOSIS — D225 Melanocytic nevi of trunk: Secondary | ICD-10-CM | POA: Diagnosis not present

## 2024-08-05 DIAGNOSIS — L578 Other skin changes due to chronic exposure to nonionizing radiation: Secondary | ICD-10-CM | POA: Diagnosis not present

## 2024-08-05 DIAGNOSIS — L729 Follicular cyst of the skin and subcutaneous tissue, unspecified: Secondary | ICD-10-CM | POA: Diagnosis not present

## 2024-08-05 DIAGNOSIS — L821 Other seborrheic keratosis: Secondary | ICD-10-CM | POA: Diagnosis not present

## 2024-08-05 DIAGNOSIS — L738 Other specified follicular disorders: Secondary | ICD-10-CM | POA: Diagnosis not present

## 2024-08-05 DIAGNOSIS — L570 Actinic keratosis: Secondary | ICD-10-CM | POA: Diagnosis not present

## 2024-08-05 DIAGNOSIS — L720 Epidermal cyst: Secondary | ICD-10-CM | POA: Diagnosis not present

## 2024-08-13 ENCOUNTER — Other Ambulatory Visit: Payer: Self-pay | Admitting: Neurology

## 2024-08-13 DIAGNOSIS — G20A1 Parkinson's disease without dyskinesia, without mention of fluctuations: Secondary | ICD-10-CM

## 2024-09-13 ENCOUNTER — Ambulatory Visit (HOSPITAL_BASED_OUTPATIENT_CLINIC_OR_DEPARTMENT_OTHER): Admitting: Obstetrics & Gynecology

## 2024-10-01 DIAGNOSIS — J069 Acute upper respiratory infection, unspecified: Secondary | ICD-10-CM | POA: Diagnosis not present

## 2024-10-01 DIAGNOSIS — R030 Elevated blood-pressure reading, without diagnosis of hypertension: Secondary | ICD-10-CM | POA: Diagnosis not present

## 2024-10-01 DIAGNOSIS — R051 Acute cough: Secondary | ICD-10-CM | POA: Diagnosis not present

## 2024-10-01 DIAGNOSIS — G20A1 Parkinson's disease without dyskinesia, without mention of fluctuations: Secondary | ICD-10-CM | POA: Diagnosis not present

## 2024-11-02 NOTE — Progress Notes (Unsigned)
 Assessment/Plan:   1.  Parkinsons Disease, with diagnosis in January, 2020  -she will take carbidopa /levodopa  25/100 CR tid  -has min dyskinesia, not bothersome to pt  -We discussed that it used to be thought that levodopa  would increase risk of melanoma but now it is believed that Parkinsons itself likely increases risk of melanoma. she is to get regular skin checks and she does that.  She sees Dr. Robinson for dermatology  2.  Chronic low back pain  -following with neurosx (previously Dr. Carollee).  She was thinking about going to [facility] since Dr. Carollee had left.  She was given the names of Dr. Katrina and Claudene in case she wanted to stay within the [facility] system.   Subjective:   Eileen Cook was seen today in follow up for Parkinsons disease.  My previous records were reviewed prior to todays visit as well as outside records available to me.  Patient still on levodopa  and doing well.  She has had no falls.  No lightheadedness or near syncope.  She follows with Dr. Robinson for dermatology and was last seen September, 2025.  Current prescribed movement disorder medications:  carbidopa /levodopa  25/100 CR, at 7 AM/noon/5 PM  PREVIOUS MEDICATIONS: Carbidopa /levodopa  25/100 IR (EDS initially but when there was a mix up at the pharmacy she retried IR and did okay with it)  ALLERGIES:   Allergies  Allergen Reactions   Codeine    Crestor [Rosuvastatin] Other (See Comments)    CURRENT MEDICATIONS:  Outpatient Encounter Medications as of 11/04/2024  Medication Sig   acyclovir  (ZOVIRAX ) 200 MG capsule Take 1 capsule (200 mg total) by mouth 2 (two) times daily.   Boswellia-Glucosamine-Vit D (GLUCOSAMINE COMPLEX PO) Take 1 tablet by mouth daily.   Calcium Carbonate-Vit D-Min (CALCIUM 1200 PO) Take 1 tablet by mouth daily.    Carbidopa -Levodopa  ER (SINEMET  CR) 25-100 MG tablet controlled release Take 1 tablet by mouth 3 (three) times daily.   cholecalciferol (VITAMIN D) 1000 units tablet Take  2,000 Units by mouth daily.   ezetimibe (ZETIA) 10 MG tablet Take 10 mg by mouth daily.   Multiple Vitamin (MULTIVITAMIN) tablet Take 1 tablet by mouth daily.   simvastatin (ZOCOR) 20 MG tablet Take 1 tablet by mouth daily.   vitamin C (ASCORBIC ACID) 250 MG tablet Take 250 mg by mouth daily.   No facility-administered encounter medications on file as of 11/04/2024.    Objective:   PHYSICAL EXAMINATION:    VITALS:   There were no vitals filed for this visit.   Wt Readings from Last 3 Encounters:  04/09/24 172 lb 9.6 oz (78.3 kg)  10/16/23 173 lb 4.8 oz (78.6 kg)  03/31/23 171 lb 6.4 oz (77.7 kg)   GEN:  The patient appears stated age and is in NAD. HEENT:  Normocephalic, atraumatic.  The mucous membranes are moist.  Cardiovascular: Regular rate rhythm Lungs: Clear to auscultation bilaterally Neck: No bruits  Neurological examination:  Orientation: The patient is alert and oriented x3. Cranial nerves: There is good facial symmetry with minimal facial hypomimia. The speech is fluent and clear. Soft palate rises symmetrically and there is no tongue deviation. Hearing is intact to conversational tone. Sensation: Sensation is intact to light touch throughout Motor: Strength is at least antigravity x4.  Movement examination: Tone: There is nl tone in the UE Abnormal movements: There is minimal tremor in the thumbs b/l (similar to previous); rare dyskinesia in the R shoulder (this is all stable) Coordination:  There  is no decremation with any form of RAMS, including alternating supination and pronation of the forearm, hand opening and closing, finger taps, heel taps and toe taps.  Gait and Station: The patient has no difficulty arising out of a deep-seated chair without the use of the hands.  Stride length is good.  Armswing is good.  Total time spent on today's visit was *** minutes, including both face-to-face time and nonface-to-face time.  Time included that spent on review of  records (prior notes available to me/labs/imaging if pertinent), discussing treatment and goals, answering patient's questions and coordinating care.    Cc:  Shayne Anes, MD

## 2024-11-04 ENCOUNTER — Ambulatory Visit: Admitting: Neurology

## 2024-11-04 ENCOUNTER — Encounter: Payer: Self-pay | Admitting: Neurology

## 2024-11-04 VITALS — BP 126/88 | HR 72 | Wt 173.4 lb

## 2024-11-04 DIAGNOSIS — M545 Low back pain, unspecified: Secondary | ICD-10-CM | POA: Diagnosis not present

## 2024-11-04 DIAGNOSIS — G8929 Other chronic pain: Secondary | ICD-10-CM | POA: Diagnosis not present

## 2024-11-04 DIAGNOSIS — G20A1 Parkinson's disease without dyskinesia, without mention of fluctuations: Secondary | ICD-10-CM | POA: Diagnosis not present

## 2024-11-04 NOTE — Patient Instructions (Signed)
°  VISIT SUMMARY: Today, we discussed the management of your Parkinson's disease, back pain, and a potential trigger finger. We also talked about your upcoming trip to [place] and the need for medication refills.  YOUR PLAN: -PARKINSON'S DISEASE: Parkinson's disease is a disorder of the nervous system that affects movement. We will adjust your carbidopa -levodopa  medication to be taken at 8 AM, noon, and 5 PM. If your leg cramps at night continue, we may consider adding a nighttime dose. Please continue your regular exercise, including yoga and indoor activities.  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 This, along with your disc displacement and vertebral fracture, is causing your back pain. We will contact [facility] for follow-up options and consider consultations with Dr. Mavis, Dr. Malcolm, or Dr. Colon for further evaluation.  INSTRUCTIONS: Please follow up with [facility] for your back pain evaluation. Continue taking your adjusted carbidopa -levodopa  medication as prescribed. If you experience persistent leg cramps at night, let us  know. Enjoy your trip to [place], and ensure you have enough medication refills for your travels.                      Contains text generated by [employer].                                 Contains text generated by [employer].

## 2024-11-29 NOTE — Progress Notes (Signed)
 "  Breast and Pelvic Patient name: Eileen Cook MRN [id number]  Date of birth: 11-28-1950 Chief Complaint:   Gynecologic Exam  History of Present Illness:   Eileen Cook is a 74 y.o. G10P0012 Caucasian female being seen today for  breast and pelvic exam.    Patient's last menstrual period was 11/25/2001.   The pregnancy intention screening data noted above was reviewed. Potential methods of contraception were discussed. The patient elected to proceed with No data recorded.   Last pap 01/30/2018. Results were: NILM w/ HRHPV not done. H/O abnormal pap: no Last mammogram: 04/07/2020. Results were: normal. Family h/o breast cancer: no Last colonoscopy: 06/09/2020. Results were: normal. Family h/o colorectal cancer: yes maternal grandmother and mother.  Repeat 6 years.  Dexa:  done with Dr. Shayne.  Has one scheduled this week.       11/30/2024    3:08 PM 09/14/2021   10:11 AM  Depression screen PHQ 2/9  Decreased Interest 0 0  Down, Depressed, Hopeless 0 0  PHQ - 2 Score 0 0    Review of Systems:   Pertinent items are noted in HPI Denies any urinary issues or pelvic pain.  Has hx of constipation.  Pertinent History Reviewed:  Reviewed past medical,surgical, social and family history.  Reviewed problem list, medications and allergies. Physical Assessment:   Vitals:   11/30/24 1506  BP: 122/81  Pulse: 62  SpO2: 98%  Weight: 170 lb (77.1 kg)  Height: 5' 8.5 (1.74 m)  Body mass index is 25.47 kg/m.        Physical Examination:   General appearance - well appearing, and in no distress  Mental status - alert, oriented to person, place, and time  Psych:  She has a normal mood and affect  Skin - warm and dry, normal color, no suspicious lesions noted  Chest - effort normal, all lung fields clear to auscultation bilaterally  Heart - normal rate and regular rhythm  Neck:  midline trachea, no thyromegaly or nodules  Breasts - breasts appear normal, no suspicious masses, no skin or  nipple changes or  axillary nodes  Abdomen - soft, nontender, nondistended, no masses or organomegaly  Pelvic - VULVA: normal appearing vulva with no masses, tenderness or lesions   VAGINA: normal appearing vagina with normal color and discharge, no lesions   CERVIX: normal appearing cervix without discharge or lesions, no CMT  Thin prep pap is not indicated  UTERUS: uterus is felt to be normal size, shape, consistency and nontender   ADNEXA: No adnexal masses or tenderness noted.  Rectal - normal rectal, good sphincter tone, no masses felt.   Extremities:  No swelling or varicosities noted  Chaperone present for exam  No results found for this or any previous visit (from the past 24 hours).  Assessment & Plan:  1. GYN exam for high-risk Medicare patient (Primary) - Pap smear not indicated - Mammogram order.  Pt hasn't had a mammogram since 2021 but is planning on updating it this year - Colonoscopy 2021.  Follow up 6 years.  - Bone mineral density planned with Dr. Shayne - lab work done with PCP, Dr. Shayne - vaccines reviewed/updated  2. Parkinson's disease without dyskinesia or fluctuating manifestations (HCC) - on Sinemet  and followed by Dr. 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 History of compression fracture of vertebral column - taking 1000 international units daily.  Discussed dosing today.   Orders Placed  This Encounter  Procedures   MM 3D SCREENING MAMMOGRAM BILATERAL BREAST    Meds: No orders of the defined types were placed in this encounter.   Follow-up: Return in about 2 years (around 11/30/2026).  Ronal GORMAN Pinal, MD 11/30/2024 4:10 PM "

## 2024-11-30 ENCOUNTER — Encounter (HOSPITAL_BASED_OUTPATIENT_CLINIC_OR_DEPARTMENT_OTHER): Payer: Self-pay | Admitting: Obstetrics & Gynecology

## 2024-11-30 ENCOUNTER — Ambulatory Visit (INDEPENDENT_AMBULATORY_CARE_PROVIDER_SITE_OTHER): Admitting: Obstetrics & Gynecology

## 2024-11-30 VITALS — BP 122/81 | HR 62 | Ht 68.5 in | Wt 170.0 lb

## 2024-11-30 DIAGNOSIS — Z01419 Encounter for gynecological examination (general) (routine) without abnormal findings: Secondary | ICD-10-CM | POA: Diagnosis not present

## 2024-11-30 DIAGNOSIS — G20A1 Parkinson's disease without dyskinesia, without mention of fluctuations: Secondary | ICD-10-CM

## 2024-11-30 DIAGNOSIS — Z1331 Encounter for screening for depression: Secondary | ICD-10-CM

## 2024-11-30 DIAGNOSIS — Z9189 Other specified personal risk factors, not elsewhere classified: Secondary | ICD-10-CM

## 2024-11-30 DIAGNOSIS — Z1231 Encounter for screening mammogram for malignant neoplasm of breast: Secondary | ICD-10-CM | POA: Diagnosis not present

## 2024-11-30 DIAGNOSIS — B009 Herpesviral infection, unspecified: Secondary | ICD-10-CM

## 2024-11-30 DIAGNOSIS — Z8781 Personal history of (healed) traumatic fracture: Secondary | ICD-10-CM

## 2024-11-30 NOTE — Patient Instructions (Signed)
Call [phone] to schedule an appointment at [facility].

## 2025-05-11 ENCOUNTER — Ambulatory Visit: Payer: Self-pay | Admitting: Physician Assistant
